# Patient Record
Sex: Female | Born: 1955 | Race: White | Hispanic: No | Smoking: Current every day smoker
Health system: Southern US, Community
[De-identification: ages and names within clinical notes are randomized; demographics above are authoritative.]

## PROBLEM LIST (undated history)

## (undated) DIAGNOSIS — M5136 Other intervertebral disc degeneration, lumbar region: Secondary | ICD-10-CM

## (undated) DIAGNOSIS — I10 Essential (primary) hypertension: Secondary | ICD-10-CM

## (undated) DIAGNOSIS — F329 Major depressive disorder, single episode, unspecified: Secondary | ICD-10-CM

## (undated) DIAGNOSIS — M797 Fibromyalgia: Secondary | ICD-10-CM

## (undated) DIAGNOSIS — M51369 Other intervertebral disc degeneration, lumbar region without mention of lumbar back pain or lower extremity pain: Secondary | ICD-10-CM

## (undated) DIAGNOSIS — K219 Gastro-esophageal reflux disease without esophagitis: Secondary | ICD-10-CM

## (undated) DIAGNOSIS — Z720 Tobacco use: Secondary | ICD-10-CM

## (undated) DIAGNOSIS — R7401 Elevation of levels of liver transaminase levels: Secondary | ICD-10-CM

## (undated) DIAGNOSIS — Z8489 Family history of other specified conditions: Secondary | ICD-10-CM

## (undated) DIAGNOSIS — H35039 Hypertensive retinopathy, unspecified eye: Secondary | ICD-10-CM

## (undated) DIAGNOSIS — M67912 Unspecified disorder of synovium and tendon, left shoulder: Secondary | ICD-10-CM

## (undated) DIAGNOSIS — F32A Depression, unspecified: Secondary | ICD-10-CM

## (undated) DIAGNOSIS — E785 Hyperlipidemia, unspecified: Secondary | ICD-10-CM

## (undated) DIAGNOSIS — N3010 Interstitial cystitis (chronic) without hematuria: Secondary | ICD-10-CM

## (undated) DIAGNOSIS — T8859XA Other complications of anesthesia, initial encounter: Secondary | ICD-10-CM

## (undated) DIAGNOSIS — M1990 Unspecified osteoarthritis, unspecified site: Secondary | ICD-10-CM

## (undated) DIAGNOSIS — R740 Nonspecific elevation of levels of transaminase and lactic acid dehydrogenase [LDH]: Secondary | ICD-10-CM

## (undated) DIAGNOSIS — E119 Type 2 diabetes mellitus without complications: Secondary | ICD-10-CM

## (undated) DIAGNOSIS — F419 Anxiety disorder, unspecified: Secondary | ICD-10-CM

## (undated) DIAGNOSIS — J45909 Unspecified asthma, uncomplicated: Secondary | ICD-10-CM

## (undated) DIAGNOSIS — M5126 Other intervertebral disc displacement, lumbar region: Secondary | ICD-10-CM

## (undated) DIAGNOSIS — H409 Unspecified glaucoma: Secondary | ICD-10-CM

## (undated) HISTORY — PX: BREAST CYST EXCISION: SHX579

## (undated) HISTORY — PX: OTHER SURGICAL HISTORY: SHX169

## (undated) HISTORY — PX: TUBAL LIGATION: SHX77

## (undated) HISTORY — DX: Unspecified glaucoma: H40.9

## (undated) HISTORY — PX: CYSTO: SHX6284

## (undated) HISTORY — PX: CATARACT EXTRACTION: SUR2

## (undated) HISTORY — PX: ABDOMINAL HYSTERECTOMY: SHX81

## (undated) HISTORY — PX: ULNAR NERVE REPAIR: SHX2594

## (undated) HISTORY — PX: CYSTOSCOPY: SUR368

## (undated) HISTORY — PX: EYE SURGERY: SHX253

## (undated) HISTORY — PX: BLEPHAROPLASTY: SUR158

## (undated) HISTORY — PX: BREAST BIOPSY: SHX20

## (undated) HISTORY — DX: Hypertensive retinopathy, unspecified eye: H35.039

---

## 2003-07-14 ENCOUNTER — Ambulatory Visit (HOSPITAL_COMMUNITY): Admission: RE | Admit: 2003-07-14 | Discharge: 2003-07-14 | Payer: Self-pay | Admitting: Urology

## 2003-07-14 ENCOUNTER — Ambulatory Visit (HOSPITAL_BASED_OUTPATIENT_CLINIC_OR_DEPARTMENT_OTHER): Admission: RE | Admit: 2003-07-14 | Discharge: 2003-07-14 | Payer: Self-pay | Admitting: Urology

## 2003-07-14 ENCOUNTER — Encounter (INDEPENDENT_AMBULATORY_CARE_PROVIDER_SITE_OTHER): Payer: Self-pay

## 2004-07-05 ENCOUNTER — Ambulatory Visit (HOSPITAL_COMMUNITY): Admission: RE | Admit: 2004-07-05 | Discharge: 2004-07-05 | Payer: Self-pay | Admitting: Surgery

## 2004-07-05 ENCOUNTER — Ambulatory Visit (HOSPITAL_BASED_OUTPATIENT_CLINIC_OR_DEPARTMENT_OTHER): Admission: RE | Admit: 2004-07-05 | Discharge: 2004-07-05 | Payer: Self-pay | Admitting: Surgery

## 2005-04-18 ENCOUNTER — Ambulatory Visit (HOSPITAL_COMMUNITY): Admission: RE | Admit: 2005-04-18 | Discharge: 2005-04-18 | Payer: Self-pay | Admitting: Urology

## 2005-04-18 ENCOUNTER — Ambulatory Visit (HOSPITAL_BASED_OUTPATIENT_CLINIC_OR_DEPARTMENT_OTHER): Admission: RE | Admit: 2005-04-18 | Discharge: 2005-04-18 | Payer: Self-pay | Admitting: Urology

## 2007-05-31 ENCOUNTER — Ambulatory Visit (HOSPITAL_BASED_OUTPATIENT_CLINIC_OR_DEPARTMENT_OTHER): Admission: RE | Admit: 2007-05-31 | Discharge: 2007-05-31 | Payer: Self-pay | Admitting: Urology

## 2007-09-05 HISTORY — PX: JOINT REPLACEMENT: SHX530

## 2008-08-10 ENCOUNTER — Inpatient Hospital Stay (HOSPITAL_COMMUNITY): Admission: RE | Admit: 2008-08-10 | Discharge: 2008-08-13 | Payer: Self-pay | Admitting: Orthopedic Surgery

## 2011-01-17 NOTE — Op Note (Signed)
NAMEBAYLE, CALVO               ACCOUNT NO.:  [id number]   MEDICAL RECORD NO.:  [id number]          PATIENT TYPE:  INP   LOCATION:  5006                         FACILITY:  [facility]   PHYSICIAN:  Robert A. Thurston Hole, M.D. DATE OF BIRTH:  [birth year]   DATE OF PROCEDURE:  DATE OF DISCHARGE:                               OPERATIVE REPORT   PREOPERATIVE DIAGNOSES:  1. Left knee degenerative joint disease.  2. 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   POSTOPERATIVE DIAGNOSES:  1. Left knee degenerative joint disease.  2. 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   PROCEDURE:  1. Left knee examination under anesthesia followed by total knee      replacement using DePuy cemented total knee system with #2 cemented      femur, #2 cemented tibia with 12.5-mm polyethylene RP tibial      spacer, and 32-mm polyethylene cemented patella.  2. Left thumb carpometacarpal joint, cortisone injection.   SURGEON:  Elana Alm. Thurston Hole, MD   ASSISTANT:  Julien Girt, PA   ANESTHESIA:  General.   OPERATIVE TIME:  1 hour and 30 minutes.   COMPLICATIONS:  None.   DESCRIPTION OF PROCEDURE:  Ms. Rister was brought to the operating room  on August 10, 2008, after a femoral nerve block was placed in holding  area by Anesthesia.  She was placed on operating table in supine  position.  After being placed under general anesthesia, she had a Foley  catheter placed under sterile conditions.  She received Ancef 2 grams IV  preoperatively for prophylaxis.  Her left knee was examined under  anesthesia.  The range of motion from -5 to 125 degrees, mild varus  deformity, knee stable, ligamentous exam with normal patellar tracking.  Her left leg was prepped using sterile DuraPrep and draped using sterile  technique.  Leg was exsanguinated and a thigh tourniquet elevated 365-  mm.  Initially through a 15 cm longitudinal incision based over the  patella, an initial exposure  was made.  The underlying subcutaneous  tissues were incised along with skin incision.  A median arthrotomy was  performed revealing excessive amount of normal-appearing joint fluid.  The articular surfaces were inspected.  She had grade 4 changes  medially, grade 3 and 4 changes laterally, and grade 3 and 4 changes in  the patellofemoral joint.  Osteophytes removed from the femoral condyles  and tibial plateau.  The medial and lateral meniscal remnants were  removed as well as the anterior cruciate ligament.  An intramedullary  drill was then drilled up the femoral canal for placement of distal  femoral cutting jig, which was placed in an appropriate amount of  rotation and a distal 11 mm cut was made.  The distal femur was incised.  The #2 was found to be the appropriate size.  The #2 cutting jig was  placed in the appropriate amount of external rotation and then these  cuts were made.  The proximal tibia was then exposed.  The tibial spines  were removed with an oscillating saw.  An intramedullary drill was  drilled down  the tibial canal for placement of proximal tibial cutting  jig, which was placed in the appropriate amount of rotation and a  proximal 6-mm cut was made based off the medial or lower side.  Spacer  blocks were then placed in flexion and extension.  A 12.5 mm blocks gave  excellent balancing, excellent stability and excellent correction of her  flexion and varus deformities.  At this point, the proximal tibial cut  surface was exposed and the #2 tibial base plate trial was placed and  this was found to be in excellent fit and the keel cut was made.  The  PCL box cutter was then placed on the distal femur and these cuts were  made.  At this point, a #2 femoral trial was placed with the #2 tibial  base plate trial and a 12.5-mm polyethylene RP tibial spacer, and the  knee was reduced, taken through a range of motion from 0 to 125 degrees  with excellent stability and  excellent correction of her flexion and  varus deformities, and normal patellar tracking.  At this point, 38-mm  resurfacing cut was made for a 32-mm patella.  The 3 locking holes  placed for a 32-mm patella.  The patella trial was placed and again  patellofemoral tracking was evaluated and found to be normal.  At this  point, it was felt that all the trial components were of excellent size,  fit, and stability.  They were then removed.  The knee was then jet  lavage irrigated with 3 liters of saline.  The proximal tibia was then  exposed and the #2 tibial base plate with cement backing was hammered  into position with an excellent fit with excess cement being removed  from around the edges.  The #2 femoral component with cement backing was  hammered into position and also with an excellent fit with excess cement  being removed from around the edges.  The 12.5-mm polyethylene RP tibial  spacer was placed on the tibial base plate, the knee reduced, taken  through a range of motion from 0 to 125 degrees with excellent stability  and excellent correction of her flexion and varus deformities.  The 32-  mm polyethylene cement backed patella was then placed in its position  and held there with a clamp.  After this was done, again patellofemoral  tracking was evaluated and found to be normal.  At this point, it was  felt that all the components were of excellent size, fit, and stability.  The knee was further irrigated with saline and then the tourniquet was  released.  Hemostasis obtained with cautery.  The arthrotomy was then  closed with #1 Ethibond suture over two medium Hemovac drains.  Subcutaneous tissue were closed with 0 and 2-0 Vicryl.  Subcuticular  layer closed with 4-0 Monocryl.  Sterile dressings and a long-leg splint  applied.  At this point, the left thumb CMC joint was injected with 40  mg of Depo-Medrol and 1 mL of 0.25% Marcaine under sterile condition and  she tolerated this  well.  At this point, the patient was then awakened,  extubated, and taken to recovery room in stable condition.  Needle and  sponge counts were correct x2 at the end the case.  Neurovascular status  normal as well postoperatively.      Robert A. Thurston Hole, M.D.  Electronically Signed     RAW/MEDQ  D:  08/10/2008  T:  08/11/2008  Job:  [id number]

## 2011-01-17 NOTE — Op Note (Signed)
NAMEJORDEN, Terri Daniel               ACCOUNT NO.:  [id number]   MEDICAL RECORD NO.:  [id number]          PATIENT TYPE:  AMB   LOCATION:  [facility]                         FACILITY:  [facility]   PHYSICIAN:  Jamison Neighbor, M.D.  DATE OF BIRTH:  [birth year]   DATE OF PROCEDURE:  05/31/2007  DATE OF DISCHARGE:                               OPERATIVE REPORT   PREOPERATIVE DIAGNOSIS:  Interstitial cystitis.   POSTOPERATIVE DIAGNOSIS:  Interstitial cystitis.   PROCEDURES:  1. Cystoscopy.  2. Urethral calibration.  3. Hydrodistention of the bladder.  4. Marcaine and Pyridium instillation.  5. Marcaine and Kenalog injection.  6. Botox injection (3 ampules).   SURGEON:  Jamison Neighbor, M.D.   ANESTHESIA:  General.   COMPLICATIONS:  None.   DRAINS:  None.   BRIEF HISTORY:  This 55 year old female is known to have severe urgency  and frequency associated with interstitial cystitis.  She also has  episodes of urgency incontinence.  The patient has been on extensive  medications and did well until recently, when she began to having  worsening problems with her situation.  We have talked to her about  InterStim as a form of neuromodulation.  We have talked to her about  additional instillation therapy, but she has responded beautifully in  the past to hydrodistention with Botox and she has requested that be  repeated.  She understands the risks and benefits of the procedure and  gave full informed consent.   PROCEDURE:  After successful induction of general anesthesia, the  patient was placed in the dorsal position, prepped with Betadine and  draped in the usual sterile fashion.  Careful bimanual examination  revealed no significant cystocele, rectocele or enterocele.  The patient  has recently had some hemorrhoid problems but there was no active  bleeding identified at this time.  The urethra was calibrated to a 79-  [place] with female urethral sounds with no sign of stenosis or  stricture.   The cystoscope was inserted.  The bladder was carefully  inspected.  No tumors or stones could be seen.  Both ureteral orifices  were normal in configuration and location.  Hydrodistention of the  bladder was performed.  The bladder was distended to a pressure of 100  cmH2O for 5 minutes.  When the bladder was drained the patient had no  real glomerulations, indicating a nice response to her therapy.  Her  bladder capacity is 750 mL, which while slightly diminished is much  better than the average IC capacity of 575.  The patient underwent Botox  injection with 3 units delivered throughout the bladder.  The patient  tolerated this without difficulty.  She was given  a Marcaine and Pyridium instillation, Marcaine and Kenalog were injected  periurethrally.  The patient tolerated the procedure well and was taken  to the recovery room in good condition.  She already has pain medication  as well as urinary analgesics, so she will simply be given doxycycline.  She will also be given Anusol-HC for her hemorrhoids.      Jamison Neighbor, M.D.  Electronically Signed  RJE/MEDQ  D:  05/31/2007  T:  06/01/2007  Job:  [id number]

## 2011-01-17 NOTE — Op Note (Signed)
[name], PURPURA               ACCOUNT NO.:  [id number]   MEDICAL RECORD NO.:  [id number]          PATIENT TYPE:  INP   LOCATION:  5006                         FACILITY:  [facility]   PHYSICIAN:  Robert A. Thurston Hole, M.D. DATE OF BIRTH:  1956-03-19   DATE OF PROCEDURE:  08/10/2008  DATE OF DISCHARGE:                               OPERATIVE REPORT   PREOPERATIVE DIAGNOSES:  1. Left knee degenerative joint disease.  2. 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   POSTOPERATIVE DIAGNOSES:  1. Left knee degenerative joint disease.  2. 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   PROCEDURES:  1. Left total knee replacement using DePuy cemented total knee system      with #2 cemented femur.  2. Cemented tibia with 12.5 mm polyethylene RP tibial spacer and 32 mm      polyethylene cemented patella.  3. Left thumb CMC joint cortisone injection.   SURGEON:  Nilda Simmer, MD.   ASSISTANT:  Ileene Hutchinson, [place].   OPERATIVE TIME:  1 hour and 30 minutes.   COMPLICATIONS:  None.   DICTATION ENDED AT THIS POINT      Robert A. Thurston Hole, M.D.     RAW/MEDQ  D:  08/10/2008  T:  08/11/2008  Job:  [id number]

## 2011-01-17 NOTE — Discharge Summary (Signed)
NAMESHERIL, HAMMOND               ACCOUNT NO.:  [id number]   MEDICAL RECORD NO.:  [id number]          Terri Daniel TYPE:  INP   LOCATION:  5006                         FACILITY:  [facility]   PHYSICIAN:  Robert A. Thurston Hole, M.D. DATE OF BIRTH:  Jan 21, 1956   DATE OF ADMISSION:  08/10/2008  DATE OF DISCHARGE:  08/13/2008                               DISCHARGE SUMMARY   ADMITTING DIAGNOSES:  1. Left knee end-stage degenerative joint disease.  2. Interstitial cystitis.  3. Irritable bowel syndrome.  4. Anxiety.  5. Depression.  6. Gastroesophageal reflux disease.  7. Glaucoma.  8. Hypertension.  9. History of a fever of unknown origin intermittently since 2006.  10.Asthma.  11.Fibromyalgia.   DISCHARGE DIAGNOSES:  1. Status post left total knee replacement.  2. Interstitial cystitis.  3. Hypertension.  4. Fibromyalgia.  5. Anxiety.  6. Depression.  7. Glaucoma.  8. History of fever of unknown origin intermittently since 2006,      currently afebrile.  9. Gastroesophageal reflux disease.  10.Irritable bowel syndrome.   HISTORY OF PRESENT ILLNESS:  The Terri Daniel is a 55 year old white female  with a history of end-stage DJD of her left knee.  Terri Daniel has failed  conservative care including interarticular cortisone injections and anti-  inflammatories.  Terri Daniel understands the risks, benefits, and possible  complications of a left total knee replacement and is without question.   Procedures in-house on August 10, 2008, the Terri Daniel underwent a left  total knee replacement by Dr. Thurston Hole, a left femoral nerve block by  anesthesia, and the Terri Daniel's Autovac only had 200 mL in it  postoperatively, so Terri Daniel did not receive an Autovac transfusion.  Postop  day 0, the Terri Daniel had episodes of apnea while on her PCA.  Terri Daniel was  given Narcan at 6 p.m. and by 6:30 was in excruciating pain.  Her mother  stayed through the night that night to watch her apnea and monitored her  PCA dosage.  Postop day 1, the  Terri Daniel was very sedated.  Terri Daniel was on  Dilaudid PCA on exam.  I discontinued her PCA due to her sedation.  Her  Foley was removed.  At the time of Foley removal, the Terri Daniel self-  administered a 10 mg Valium suppository.  The Terri Daniel has interstitial  cystitis and whenever catheters are inserted and/or removed, Terri Daniel needs a  Valium suppository to prevent bladder spasms.  Her interstitial cystitis  is monitored by Dr. Logan Bores the urologist.  Because of her sedation from  pain medicine Terri Daniel was placed on Tylenol 325 mg 2 tablets every 4 hours  scheduled.  OxyIR 5 mg 1-2 q.4 h. was ordered p.r.n. for pain control,  but with instructions not to give if Terri Daniel is too sedated.  Terri Daniel was given  milk of magnesia to facilitate her bowels.  Postop day 2, the Terri Daniel  between postop day 1 and postop day 2, the Terri Daniel fell getting up from  the toilet.  Terri Daniel did not call for help.  Terri Daniel attempted to do this on her  own and her left knee buckled and Terri Daniel fell.  X-rays were taken.  No  abnormalities were seen.  There was no damage to her wound.  On  examination in the morning of postop day 2, Terri Daniel was doing very well.  Her hemoglobin was stable at 9.9, T-max was 99.5.  Terri Daniel was metabolically  stable.  An INR was therapeutic at 2.3.  Her Lovenox was discontinued.  Terri Daniel ambulated 100 feet minimal guard assist.  Terri Daniel was given a Dulcolax  suppository.  Postop day 3, the Terri Daniel is alert, oriented, and in no  acute distress.  Terri Daniel is dressed.  Her surgical wound is well  approximated.  Mepitel was intact.  Terri Daniel is neurovascularly intact.  Unable to do a straight leg raise, but does have some early quad  control.  Her hemoglobin is 11.5.  Terri Daniel is metabolically stable with a  normal basic metabolic panel.  Blood pressure stable at 129/85.  Terri Daniel has  been afebrile for greater than 24 hours.  Terri Daniel is having no difficulty  with her bladder at this time.  Terri Daniel is being discharged to a skilled  nursing facility today.  Her plans are  to remain in skilled nursing  facility until her daughter gets home from college on December 23.  The  Terri Daniel is single with a significant other.  Terri Daniel is concerned that her  significant other cannot care for by himself and would like to await to  stay at a skilled nursing facility until her daughter gets home from  college.  Terri Daniel is being discharged to a skilled nursing facility in  stable condition.  Terri Daniel is weightbearing as tolerated.  Terri Daniel is on a  regular diet.   DISCHARGE MEDICATIONS:  1. Tylenol 325 mg 2 tablets q.4 h. scheduled.  2. OxyIR 5 mg 1-2 tablets q.4 h. p.r.n. pain.  3. Coumadin per pharmacy protocol.  Her Coumadin doses while Terri Daniel has      been in the hospital are, on August 10, 2008, Terri Daniel was given 7.5 mg      of Coumadin, which raised her INR to 1.  On August 11, 2008, Terri Daniel      was given 7.5 mg again, and on August 12, 2008, her INR was 2.3.      On August 12, 2008, her Coumadin was held, and on August 13, 2008, her INR was still therapeutic at 2.3.  The rest of her      discharge medications are as follows.  4. Lisinopril.  5. Hydrochlorothiazide 20/12.5 one tablet a day, hold until blood      pressure is greater than 120/80.  6. Celebrex 200 mg 1 tablet a day.  7. Crestor 10 mg 1 tablet a day.  8. Clonidine 0.1 mg 1 tablet every evening.  9. Hydroxyzine 25 mg 1 tablet every evening.  10.Savella 50 mg 1 tablet twice a day.  The Terri Daniel brought own supply      to the hospital.  11.Elavil 25 mg 1 tablet every p.m.  12.Potassium chloride 10 mEq 1 tablet every morning.  This is      currently on hold.  13.Ambien 10 mg p.o. at bedtime p.r.n. sleep.  14.Prilosec 20 mg 1 tablet q.a.m.  15.Fluticasone as needed.  16.Valium suppositories 10 mg 1 PR for self-catheterization as needed      for interstitial cystitis no more often than q.8 h. currently on      hold.  17.Valium 5 mg 1 tablet p.o. q.12 h. p.r.n. anxiety.  18.Colace  100 mg 1 tablet twice a day.   19.Senokot-S 2 tablets with dinner if no BM in 2 days.  20.Dulcolax suppository 1 PR q.4 h. p.r.n. if no BM in 3 days.   DISCHARGE INSTRUCTIONS:  Terri Daniel will need to follow up in Dr. Sherene Sires  office on August 24, 2008.  Please call [phone] to make that  appointment.  Please call our office [phone] with increased pain,  increased swelling, increased redness, increased drainage, or temp  greater than 101.5.      Kirstin Shepperson, P.A.      Robert A. Thurston Hole, M.D.  Electronically Signed    KS/MEDQ  D:  08/13/2008  T:  08/13/2008  Job:  [id number]

## 2011-01-20 NOTE — Op Note (Signed)
[name], Terri Daniel               ACCOUNT NO.:  [id number]   MEDICAL RECORD NO.:  [id number]          PATIENT TYPE:  AMB   LOCATION:  [facility]                         FACILITY:  [facility]   PHYSICIAN:  Jamison Neighbor, M.D.  DATE OF BIRTH:  [birth year]   DATE OF PROCEDURE:  07/05/2004  DATE OF DISCHARGE:                                 OPERATIVE REPORT   PREOPERATIVE DIAGNOSIS:  Interstitial cystitis.   POSTOPERATIVE DIAGNOSIS:  Interstitial cystitis.   PROCEDURE:  Cystoscopy, urethral calibration, hydrodistention of the  bladder, Marcaine and Pyridium installation, Marcaine and __________  injection.   SURGEON:  Jamison Neighbor, M.D.   ANESTHESIA:  General.   COMPLICATIONS:  None.   DRAINS:  None.   INDICATIONS FOR PROCEDURE:  This 55 year old female is known to have  interstitial cystitis.  The patient has had a cystoscopy and  hydrodistention, which has confirmed this diagnosis.  The patient has had a  significant flare of her symptoms with very poor control of her bladder  pain.  She has also had ongoing issues with pelvic floor dysfunction and  fibromyalgia.  The patient has not done well with installation therapy or  oral therapy or physical therapy.  It is requested that she had a repeat  hydrodistention.  She understands the risks and benefits of the procedure,  and gave a full informed consent.   DESCRIPTION OF PROCEDURE:  After a successful induction of general  anesthesia, the patient was placed in the dorsal lithotomy position and  prepped with Betadine and draped in the usual sterile fashion.  The careful  bimanual examination revealed no cystocele, rectocele, or enterocele.  There  are no masses on bimanual examination.  The urethra was free of any masses  visibly, specifically no evidence of a diverticulum.  The urethra was  calibrated to 32-French with __________  urethral sounds, with no evidence  of stenosis or stricture.  The cystoscope was inserted.  The  bladder was  carefully inspected.  It was free of any tumors or stones.  Both ureteral  orifices were of normal configuration and location.  The patient had no  evidence of tumor or other abnormalities in the bladder mucosa.  The bladder  was distended at a pressure of 100 cm of water for five minutes.  When the  bladder was drained, glomerulations could be seen throughout the bladder.  The bladder capacity was 800 mL, which is better than the average  interstitial cystitis volume of 575, but somewhat worse than average volume  of 1150 mL.  There was nothing that required a biopsy at this time.  The  bladder was drained and a mixture of Marcaine and Pyridium was left in the  bladder.  Marcaine and __________  were injected for a pudendal block.  The  patient received intraoperative Toradol, Zofran and B&O suppository.   DISPOSITION:  She will be sent home with Pyridium Plus and doxycycline.  The  patient already has OxyContin for long-term pain management, so will not  require additional pain medications.  The patient will return to the office  in followup,  at which time we will discuss other options for therapy.      RJE/MEDQ  D:  07/05/2004  T:  07/05/2004  Job:  [id number]   cc:   Dewain Penning, M.D.  [place], [place]

## 2011-01-20 NOTE — Op Note (Signed)
Terri Daniel, CUFF                           ACCOUNT NO.:  [id number]   MEDICAL RECORD NO.:  [id number]                   PATIENT TYPE:  AMB   LOCATION:  [facility]                                 FACILITY:  [facility]   PHYSICIAN:  Jamison Neighbor, M.D.               DATE OF BIRTH:  10-13-1955   DATE OF PROCEDURE:  07/14/2003  DATE OF DISCHARGE:                                 OPERATIVE REPORT   PREOPERATIVE DIAGNOSES:  1. Chronic pelvic pain.  2. Probable interstitial cystitis.   POSTOPERATIVE DIAGNOSIS:  Interstitial cystitis.   PROCEDURES:  1. Cystoscopy.  2. Urethral calibration.  3. Hydrodistention of bladder.  4. Bladder biopsy.  5. Marcaine and Pyridium instillation.  6. Marcaine and Kenalog injection.   SURGEON:  Jamison Neighbor, M.D.   ANESTHESIA:  General.   COMPLICATIONS:  None.   DRAINS:  None.   BRIEF HISTORY:  This patient has had problems with chronic lower urinary  tract urgency, frequency, and pain.  She has been treated at [facility] by Dr. Lorin Picket McDiarmid for dysfunctional voiding and has been on  a combination of Flomax and Elavil.  The patient is felt to have  interstitial cystitis and needs to undergo diagnostic evaluation.  She was  given the opportunity to consider potassium provocation testing but  requested a hydrodistention be done as she is very fearful of the  possibility that she might have a bladder cancer.  She was reassured that  bladder cancer was a very low risk for her but the patient insists that  cystoscopic examination and biopsy be performed.  She understands the risks  and benefits of the procedure and gave full and informed consent.   DESCRIPTION OF PROCEDURE:  After the successful induction of general  anesthesia, the patient was placed in the dorsal lithotomy position, prepped  with Betadine, and draped in the usual sterile fashion.  Careful bimanual  examination revealed no cystocele, rectocele, or enterocele.  The  uterus was  palpably normal and not particularly enlarged.  The urethra was calibrated  at 73 [place] with female urethral sounds without evidence of stenosis or  stricture.  The cystoscope was inserted, the bladder was carefully  inspected.  It was free of any tumor or stones.  Both ureteral orifices were  normal in configuration and location.  Clear urine was seen to efflux from  each.  The bladder was distended at a pressure of 100 cmH2O for five  minutes.  When the bladder was drained the patient was found to have a  terminal blood tinge.  Glomerulations could be seen throughout the bladder.  Bladder capacity was diminished at 700 mL.  This compares to a normal  bladder capacity of 1150 under anesthesia and an average for interstitial  cystitis in one series of 575.  A biopsy was taken and was sent for mast  cell analysis as well  as to rule out carcinoma in situ.  The biopsy site was  cauterized.  A mixture of Marcaine and Pyridium was left within the bladder.  Marcaine and Kenalog were injected periurethrally.  The patient received  intraoperative B&O suppository as well as intraoperative Zofran and Toradol.  She will be sent home on Lorcet Plus, Pyridium Plus, and antibiotic  coverage, to return to the office in two to three weeks' time.  Will  continue the patient on her combination medications, including Elavil,  Elmiron, Atarax, Flomax, as well as other supportive therapy as needed.  The  patient will also be given instructions on instillation therapy and will be  considered for Interstim testing as well.                                               Jamison Neighbor, M.D.    RJE/MEDQ  D:  07/14/2003  T:  07/14/2003  Job:  [id number]   cc:   Lorin Picket McDiarmid, MD., [facility]

## 2011-01-20 NOTE — Op Note (Signed)
Terri Daniel, Terri Daniel               ACCOUNT NO.:  [id number]   MEDICAL RECORD NO.:  [id number]          PATIENT TYPE:  AMB   LOCATION:  [facility]                         FACILITY:  [facility]   PHYSICIAN:  Jamison Neighbor, M.D.  DATE OF BIRTH:  [birth year]   DATE OF PROCEDURE:  04/18/2005  DATE OF DISCHARGE:                                 OPERATIVE REPORT   SERVICE:  Urology.   PREOPERATIVE DIAGNOSES:  Interstitial cystitis.   POSTOPERATIVE DIAGNOSES:  Interstitial cystitis.   PROCEDURE:  Cystoscopy, urethral calibration, hydrodistention of the  bladder, Marcaine and Pyridium installation, Marcaine and Kenalog injection.   SURGEON:  Jamison Neighbor, M.D.   ANESTHESIA:  General.   COMPLICATIONS:  None.   DRAINS:  None.   BRIEF HISTORY:  This 55 year old female was known to have interstitial  cystitis and has undergone previous diagnostic cystoscopy and  hydrodistention. The patient is on very aggressive management with multiple  medications including Elmiron, Elavil, Atarax, Flexeril and OxyContin along  with valium for her pelvic floor dysfunction. She has not been able to  control her symptoms and has requested a repeat hydrodistention. The  patient's last hydrodistention was in November of last year with good  results. This seemed to hold her for many months. She understands that there  is no guarantee that she will have the same response that she has had in the  past. She gave full and informed consent. The fact that the patient is still  smoking is one reason it is thought that a repeat cystoscopy should be  performed in order ensure that her symptoms are due to IC and not to  carcinoma in situ or transitional cell carcinoma.   DESCRIPTION OF PROCEDURE:  After successful induction of general anesthesia,  the patient was placed in the dorsal lithotomy position, prepped with  Betadine and draped in the usual sterile fashion. The patient has had a  fairly recent abdominal  hysterectomy. Careful manual inspection and visual  inspection showed that the cuff was well supported with no signs of any  drainage or infection. The patient has noted that she has been having  problems with what she describes as febrile events but no abnormalities  related to the hysterectomy could be identified on bimanual exam. There was  no significant cystocele, rectocele or enterocele. The urethra was  calibrated at 80 [place] with female urethral sounds with no evidence of  stenosis or stricture. The cystoscope was inserted, the bladder was  carefully inspected and was free of any tumor or stones. There was at best  minimal squamous metaplasia with no bleeding of any kind. The ureteral  orifices were normal, the urine coming out was clear. Careful inspection  with 12 and 70 degree lenses showed no evidence whatsoever for carcinoma in  situ or TCC.  The bladder was distended at a pressure of 100 cm of water for  five minutes. When the bladder was drained, the bladder capacity was found  to be just over 700 mL. There was very minimal bleeding and much more modest  glomerulations than had been seen in  the past. We would assume that this is  due to the improvement she has had with her Elmiron. The bladder was  drained and a mixture of Marcaine and Pyridium was left within the bladder,  Marcaine and Kenalog mixture was injected into the periurethral space for a  pudendal nerve block. The patient tolerated the procedure well and was taken  to the recovery room in good condition.           ______________________________  Jamison Neighbor, M.D.  Electronically Signed     RJE/MEDQ  D:  04/18/2005  T:  04/18/2005  Job:  [id number]

## 2011-06-09 LAB — CBC
HCT: 29.3 % — ABNORMAL LOW (ref 36.0–46.0)
Hemoglobin: 11.5 g/dL — ABNORMAL LOW (ref 12.0–15.0)
Hemoglobin: 9.9 g/dL — ABNORMAL LOW (ref 12.0–15.0)
MCV: 87.4 fL (ref 78.0–100.0)
Platelets: 318 10*3/uL (ref 150–400)
Platelets: 348 10*3/uL (ref 150–400)
RBC: 3.29 MIL/uL — ABNORMAL LOW (ref 3.87–5.11)
RBC: 3.62 MIL/uL — ABNORMAL LOW (ref 3.87–5.11)
RBC: 3.81 MIL/uL — ABNORMAL LOW (ref 3.87–5.11)
RBC: 4.59 MIL/uL (ref 3.87–5.11)
RDW: 13.6 % (ref 11.5–15.5)
WBC: 10.1 10*3/uL (ref 4.0–10.5)
WBC: 10.5 10*3/uL (ref 4.0–10.5)
WBC: 15.7 10*3/uL — ABNORMAL HIGH (ref 4.0–10.5)
WBC: 9.9 10*3/uL (ref 4.0–10.5)

## 2011-06-09 LAB — PROTIME-INR
INR: 1 (ref 0.00–1.49)
INR: 2.3 — ABNORMAL HIGH (ref 0.00–1.49)
INR: 2.3 — ABNORMAL HIGH (ref 0.00–1.49)
Prothrombin Time: 13.9 seconds (ref 11.6–15.2)
Prothrombin Time: 26.9 seconds — ABNORMAL HIGH (ref 11.6–15.2)

## 2011-06-09 LAB — URINALYSIS, ROUTINE W REFLEX MICROSCOPIC
Glucose, UA: NEGATIVE mg/dL
Ketones, ur: NEGATIVE mg/dL
pH: 6 (ref 5.0–8.0)

## 2011-06-09 LAB — BASIC METABOLIC PANEL
BUN: 11 mg/dL (ref 6–23)
Calcium: 8.2 mg/dL — ABNORMAL LOW (ref 8.4–10.5)
Chloride: 109 mEq/L (ref 96–112)
Creatinine, Ser: 0.71 mg/dL (ref 0.4–1.2)
Creatinine, Ser: 1.07 mg/dL (ref 0.4–1.2)
GFR calc Af Amer: >60 mL/min (ref >60)
GFR calc Af Amer: >60 mL/min (ref >60)
GFR calc non Af Amer: 54 mL/min — ABNORMAL LOW (ref >60)
GFR calc non Af Amer: >60 mL/min (ref >60)
Potassium: 3.9 mEq/L (ref 3.5–5.1)
Potassium: 3.9 mEq/L (ref 3.5–5.1)
Sodium: 136 mEq/L (ref 135–145)
Sodium: 138 mEq/L (ref 135–145)
Sodium: 142 mEq/L (ref 135–145)

## 2011-06-09 LAB — DIFFERENTIAL
Basophils Absolute: 0.1 10*3/uL (ref 0.0–0.1)
Basophils Relative: 1 % (ref 0–1)
Eosinophils Absolute: 0.1 10*3/uL (ref 0.0–0.7)
Eosinophils Relative: 1 % (ref 0–5)
Lymphocytes Relative: 37 % (ref 12–46)
Monocytes Absolute: 0.5 10*3/uL (ref 0.1–1.0)

## 2011-06-09 LAB — URINE CULTURE: Colony Count: 8000

## 2011-06-09 LAB — COMPREHENSIVE METABOLIC PANEL
ALT: 47 U/L — ABNORMAL HIGH (ref 0–35)
AST: 27 U/L (ref 0–37)
Albumin: 3.9 g/dL (ref 3.5–5.2)
Alkaline Phosphatase: 116 U/L (ref 39–117)
Chloride: 104 mEq/L (ref 96–112)
GFR calc Af Amer: >60 mL/min (ref >60)
Potassium: 3.7 mEq/L (ref 3.5–5.1)
Total Bilirubin: 0.4 mg/dL (ref 0.3–1.2)

## 2011-06-09 LAB — TYPE AND SCREEN: Antibody Screen: NEGATIVE

## 2011-06-15 LAB — POCT I-STAT 4, (NA,K, GLUC, HGB,HCT)
Glucose, Bld: 107 — ABNORMAL HIGH
Hemoglobin: 13.3
Potassium: 4
Sodium: 136

## 2011-07-02 DIAGNOSIS — R350 Frequency of micturition: Secondary | ICD-10-CM | POA: Insufficient documentation

## 2011-07-02 DIAGNOSIS — R351 Nocturia: Secondary | ICD-10-CM | POA: Insufficient documentation

## 2011-10-12 DIAGNOSIS — F32A Depression, unspecified: Secondary | ICD-10-CM | POA: Insufficient documentation

## 2011-10-12 DIAGNOSIS — F419 Anxiety disorder, unspecified: Secondary | ICD-10-CM | POA: Insufficient documentation

## 2011-10-12 DIAGNOSIS — K589 Irritable bowel syndrome without diarrhea: Secondary | ICD-10-CM | POA: Insufficient documentation

## 2013-09-09 DIAGNOSIS — R319 Hematuria, unspecified: Secondary | ICD-10-CM | POA: Insufficient documentation

## 2014-07-14 DIAGNOSIS — G588 Other specified mononeuropathies: Secondary | ICD-10-CM | POA: Insufficient documentation

## 2015-10-20 DIAGNOSIS — I10 Essential (primary) hypertension: Secondary | ICD-10-CM | POA: Insufficient documentation

## 2015-10-20 DIAGNOSIS — K219 Gastro-esophageal reflux disease without esophagitis: Secondary | ICD-10-CM | POA: Insufficient documentation

## 2015-11-14 DIAGNOSIS — Z8371 Family history of colonic polyps: Secondary | ICD-10-CM | POA: Insufficient documentation

## 2015-11-15 DIAGNOSIS — R1310 Dysphagia, unspecified: Secondary | ICD-10-CM | POA: Insufficient documentation

## 2015-11-15 DIAGNOSIS — R748 Abnormal levels of other serum enzymes: Secondary | ICD-10-CM | POA: Insufficient documentation

## 2015-12-23 DIAGNOSIS — J4530 Mild persistent asthma, uncomplicated: Secondary | ICD-10-CM | POA: Insufficient documentation

## 2015-12-30 DIAGNOSIS — M25871 Other specified joint disorders, right ankle and foot: Secondary | ICD-10-CM | POA: Insufficient documentation

## 2015-12-30 DIAGNOSIS — M25571 Pain in right ankle and joints of right foot: Secondary | ICD-10-CM | POA: Insufficient documentation

## 2016-08-14 ENCOUNTER — Ambulatory Visit: Payer: Self-pay

## 2017-01-08 ENCOUNTER — Encounter: Payer: Self-pay | Admitting: Endocrinology

## 2017-01-29 DIAGNOSIS — H401111 Primary open-angle glaucoma, right eye, mild stage: Secondary | ICD-10-CM | POA: Insufficient documentation

## 2017-01-29 DIAGNOSIS — H401121 Primary open-angle glaucoma, left eye, mild stage: Secondary | ICD-10-CM | POA: Insufficient documentation

## 2017-02-06 ENCOUNTER — Emergency Department (HOSPITAL_COMMUNITY): Payer: Medicare HMO

## 2017-02-06 ENCOUNTER — Observation Stay (HOSPITAL_COMMUNITY)
Admission: EM | Admit: 2017-02-06 | Discharge: 2017-02-07 | Disposition: A | Discharge status: Home / Self Care | Payer: Medicare HMO | Attending: Internal Medicine | Admitting: Internal Medicine

## 2017-02-06 ENCOUNTER — Observation Stay (HOSPITAL_BASED_OUTPATIENT_CLINIC_OR_DEPARTMENT_OTHER): Payer: Medicare HMO

## 2017-02-06 ENCOUNTER — Encounter (HOSPITAL_COMMUNITY): Payer: Self-pay | Admitting: Nurse Practitioner

## 2017-02-06 DIAGNOSIS — G8929 Other chronic pain: Secondary | ICD-10-CM | POA: Diagnosis not present

## 2017-02-06 DIAGNOSIS — R0789 Other chest pain: Secondary | ICD-10-CM | POA: Diagnosis not present

## 2017-02-06 DIAGNOSIS — M1990 Unspecified osteoarthritis, unspecified site: Secondary | ICD-10-CM | POA: Insufficient documentation

## 2017-02-06 DIAGNOSIS — I259 Chronic ischemic heart disease, unspecified: Secondary | ICD-10-CM

## 2017-02-06 DIAGNOSIS — I200 Unstable angina: Secondary | ICD-10-CM | POA: Diagnosis present

## 2017-02-06 DIAGNOSIS — M67912 Unspecified disorder of synovium and tendon, left shoulder: Secondary | ICD-10-CM | POA: Diagnosis present

## 2017-02-06 DIAGNOSIS — M25812 Other specified joint disorders, left shoulder: Secondary | ICD-10-CM | POA: Insufficient documentation

## 2017-02-06 DIAGNOSIS — R079 Chest pain, unspecified: Secondary | ICD-10-CM | POA: Diagnosis not present

## 2017-02-06 DIAGNOSIS — M797 Fibromyalgia: Secondary | ICD-10-CM | POA: Diagnosis present

## 2017-02-06 DIAGNOSIS — K219 Gastro-esophageal reflux disease without esophagitis: Secondary | ICD-10-CM | POA: Diagnosis not present

## 2017-02-06 DIAGNOSIS — N3010 Interstitial cystitis (chronic) without hematuria: Secondary | ICD-10-CM | POA: Insufficient documentation

## 2017-02-06 DIAGNOSIS — E1165 Type 2 diabetes mellitus with hyperglycemia: Secondary | ICD-10-CM | POA: Diagnosis present

## 2017-02-06 DIAGNOSIS — Z882 Allergy status to sulfonamides status: Secondary | ICD-10-CM | POA: Diagnosis not present

## 2017-02-06 DIAGNOSIS — Z8249 Family history of ischemic heart disease and other diseases of the circulatory system: Secondary | ICD-10-CM | POA: Diagnosis not present

## 2017-02-06 DIAGNOSIS — F17210 Nicotine dependence, cigarettes, uncomplicated: Secondary | ICD-10-CM | POA: Insufficient documentation

## 2017-02-06 DIAGNOSIS — D72829 Elevated white blood cell count, unspecified: Secondary | ICD-10-CM | POA: Insufficient documentation

## 2017-02-06 DIAGNOSIS — Z6831 Body mass index (BMI) 31.0-31.9, adult: Secondary | ICD-10-CM | POA: Diagnosis not present

## 2017-02-06 DIAGNOSIS — F329 Major depressive disorder, single episode, unspecified: Secondary | ICD-10-CM | POA: Diagnosis not present

## 2017-02-06 DIAGNOSIS — I10 Essential (primary) hypertension: Secondary | ICD-10-CM | POA: Insufficient documentation

## 2017-02-06 DIAGNOSIS — R61 Generalized hyperhidrosis: Secondary | ICD-10-CM | POA: Insufficient documentation

## 2017-02-06 DIAGNOSIS — E669 Obesity, unspecified: Secondary | ICD-10-CM | POA: Insufficient documentation

## 2017-02-06 DIAGNOSIS — Z780 Asymptomatic menopausal state: Secondary | ICD-10-CM | POA: Diagnosis not present

## 2017-02-06 DIAGNOSIS — R252 Cramp and spasm: Secondary | ICD-10-CM | POA: Diagnosis not present

## 2017-02-06 DIAGNOSIS — J45909 Unspecified asthma, uncomplicated: Secondary | ICD-10-CM | POA: Diagnosis present

## 2017-02-06 DIAGNOSIS — E7800 Pure hypercholesterolemia, unspecified: Secondary | ICD-10-CM | POA: Diagnosis not present

## 2017-02-06 DIAGNOSIS — M549 Dorsalgia, unspecified: Secondary | ICD-10-CM | POA: Diagnosis not present

## 2017-02-06 DIAGNOSIS — F419 Anxiety disorder, unspecified: Secondary | ICD-10-CM | POA: Insufficient documentation

## 2017-02-06 DIAGNOSIS — E1169 Type 2 diabetes mellitus with other specified complication: Secondary | ICD-10-CM | POA: Diagnosis present

## 2017-02-06 DIAGNOSIS — E785 Hyperlipidemia, unspecified: Secondary | ICD-10-CM | POA: Diagnosis not present

## 2017-02-06 HISTORY — DX: Interstitial cystitis (chronic) without hematuria: N30.10

## 2017-02-06 HISTORY — DX: Other intervertebral disc displacement, lumbar region: M51.26

## 2017-02-06 HISTORY — DX: Other intervertebral disc degeneration, lumbar region without mention of lumbar back pain or lower extremity pain: M51.369

## 2017-02-06 HISTORY — DX: Unspecified disorder of synovium and tendon, left shoulder: M67.912

## 2017-02-06 HISTORY — DX: Other intervertebral disc degeneration, lumbar region: M51.36

## 2017-02-06 HISTORY — DX: Hyperlipidemia, unspecified: E78.5

## 2017-02-06 HISTORY — DX: Elevation of levels of liver transaminase levels: R74.01

## 2017-02-06 HISTORY — DX: Unspecified osteoarthritis, unspecified site: M19.90

## 2017-02-06 HISTORY — DX: Type 2 diabetes mellitus without complications: E11.9

## 2017-02-06 HISTORY — DX: Anxiety disorder, unspecified: F41.9

## 2017-02-06 HISTORY — DX: Major depressive disorder, single episode, unspecified: F32.9

## 2017-02-06 HISTORY — DX: Depression, unspecified: F32.A

## 2017-02-06 HISTORY — DX: Essential (primary) hypertension: I10

## 2017-02-06 HISTORY — DX: Fibromyalgia: M79.7

## 2017-02-06 HISTORY — DX: Nonspecific elevation of levels of transaminase and lactic acid dehydrogenase (ldh): R74.0

## 2017-02-06 HISTORY — DX: Tobacco use: Z72.0

## 2017-02-06 HISTORY — DX: Gastro-esophageal reflux disease without esophagitis: K21.9

## 2017-02-06 HISTORY — DX: Unspecified asthma, uncomplicated: J45.909

## 2017-02-06 LAB — URINALYSIS, ROUTINE W REFLEX MICROSCOPIC
BACTERIA UA: NONE SEEN
Bilirubin Urine: NEGATIVE
Hgb urine dipstick: NEGATIVE
KETONES UR: NEGATIVE mg/dL
Leukocytes, UA: NEGATIVE
Nitrite: NEGATIVE
PH: 5 (ref 5.0–8.0)
Protein, ur: NEGATIVE mg/dL
SPECIFIC GRAVITY, URINE: 1.02 (ref 1.005–1.030)

## 2017-02-06 LAB — MRSA PCR SCREENING: MRSA BY PCR: NEGATIVE

## 2017-02-06 LAB — CBC
HEMATOCRIT: 39.6 % (ref 36.0–46.0)
Hemoglobin: 13 g/dL (ref 12.0–15.0)
MCH: 29.4 pg (ref 26.0–34.0)
MCHC: 32.8 g/dL (ref 30.0–36.0)
MCV: 89.6 fL (ref 78.0–100.0)
PLATELETS: 292 10*3/uL (ref 150–400)
RBC: 4.42 MIL/uL (ref 3.87–5.11)
RDW: 13.7 % (ref 11.5–15.5)
WBC: 13 10*3/uL — ABNORMAL HIGH (ref 4.0–10.5)

## 2017-02-06 LAB — MAGNESIUM: MAGNESIUM: 2.2 mg/dL (ref 1.7–2.4)

## 2017-02-06 LAB — LIPID PANEL
CHOL/HDL RATIO: 5.1 ratio
Cholesterol: 279 mg/dL — ABNORMAL HIGH (ref 0–200)
HDL: 55 mg/dL (ref >40)
LDL CALC: 197 mg/dL — AB (ref 0–99)
Triglycerides: 137 mg/dL (ref <150)
VLDL: 27 mg/dL (ref 0–40)

## 2017-02-06 LAB — BASIC METABOLIC PANEL
Anion gap: 11 (ref 5–15)
BUN: 15 mg/dL (ref 6–20)
CO2: 20 mmol/L — AB (ref 22–32)
CREATININE: 0.96 mg/dL (ref 0.44–1.00)
Calcium: 9.2 mg/dL (ref 8.9–10.3)
Chloride: 105 mmol/L (ref 101–111)
GFR calc Af Amer: >60 mL/min (ref >60)
GFR calc non Af Amer: >60 mL/min (ref >60)
GLUCOSE: 167 mg/dL — AB (ref 65–99)
POTASSIUM: 3.8 mmol/L (ref 3.5–5.1)
Sodium: 136 mmol/L (ref 135–145)

## 2017-02-06 LAB — I-STAT TROPONIN, ED: Troponin i, poc: 0 ng/mL (ref 0.00–0.08)

## 2017-02-06 LAB — HEPATIC FUNCTION PANEL
ALBUMIN: 3.8 g/dL (ref 3.5–5.0)
ALT: 46 U/L (ref 14–54)
AST: 24 U/L (ref 15–41)
Alkaline Phosphatase: 89 U/L (ref 38–126)
Bilirubin, Direct: <0.1 mg/dL — ABNORMAL LOW (ref 0.1–0.5)
Total Bilirubin: 0.7 mg/dL (ref 0.3–1.2)
Total Protein: 6.5 g/dL (ref 6.5–8.1)

## 2017-02-06 LAB — GLUCOSE, CAPILLARY
GLUCOSE-CAPILLARY: 134 mg/dL — AB (ref 65–99)
Glucose-Capillary: 135 mg/dL — ABNORMAL HIGH (ref 65–99)

## 2017-02-06 LAB — HEPARIN LEVEL (UNFRACTIONATED): Heparin Unfractionated: 0.38 IU/mL (ref 0.30–0.70)

## 2017-02-06 LAB — TROPONIN I
Troponin I: <0.03 ng/mL (ref <0.03)
Troponin I: <0.03 ng/mL (ref <0.03)

## 2017-02-06 MED ORDER — ASPIRIN 81 MG PO CHEW: 81.0000 mg | CHEWABLE_TABLET | Freq: Every day | ORAL | Status: DC

## 2017-02-06 MED ORDER — ACETAMINOPHEN 325 MG PO TABS: 650.0000 mg | ORAL_TABLET | ORAL | Status: DC | PRN

## 2017-02-06 MED ORDER — ONDANSETRON HCL 4 MG/2ML IJ SOLN: 4.0000 mg | Freq: Four times a day (QID) | INTRAMUSCULAR | Status: DC | PRN

## 2017-02-06 MED ORDER — INSULIN ASPART 100 UNIT/ML ~~LOC~~ SOLN
0.0000 [IU] | Freq: Three times a day (TID) | SUBCUTANEOUS | Status: DC
  Administered 2017-02-06: 1 [IU] via SUBCUTANEOUS
  Administered 2017-02-07: 2 [IU] via SUBCUTANEOUS

## 2017-02-06 MED ORDER — ASPIRIN EC 325 MG PO TBEC: 325.0000 mg | DELAYED_RELEASE_TABLET | Freq: Every day | ORAL | Status: DC

## 2017-02-06 MED ORDER — HEPARIN (PORCINE) IN NACL 100-0.45 UNIT/ML-% IJ SOLN
950.0000 [IU]/h | INTRAMUSCULAR | Status: DC
  Administered 2017-02-06: 900 [IU]/h via INTRAVENOUS
  Filled 2017-02-06: qty 250

## 2017-02-06 MED ORDER — INSULIN ASPART 100 UNIT/ML ~~LOC~~ SOLN
0.0000 [IU] | Freq: Every day | SUBCUTANEOUS | Status: DC
Start: 2017-02-06 — End: 2017-02-07

## 2017-02-06 MED ORDER — METOPROLOL TARTRATE 25 MG PO TABS
25.0000 mg | ORAL_TABLET | Freq: Two times a day (BID) | ORAL | Status: DC
  Administered 2017-02-06 – 2017-02-07 (×2): 25 mg via ORAL
  Filled 2017-02-06 (×3): qty 1

## 2017-02-06 MED ORDER — GI COCKTAIL ~~LOC~~: 30.0000 mL | Freq: Four times a day (QID) | ORAL | Status: DC | PRN

## 2017-02-06 MED ORDER — NITROGLYCERIN 0.4 MG SL SUBL
SUBLINGUAL_TABLET | SUBLINGUAL | Status: AC
  Filled 2017-02-06: qty 3

## 2017-02-06 MED ORDER — HEPARIN BOLUS VIA INFUSION
4000.0000 [IU] | Freq: Once | INTRAVENOUS | Status: AC
  Administered 2017-02-06: 4000 [IU] via INTRAVENOUS
  Filled 2017-02-06: qty 4000

## 2017-02-06 MED ORDER — MORPHINE SULFATE (PF) 4 MG/ML IV SOLN: 1.0000 mg | INTRAVENOUS | Status: DC | PRN

## 2017-02-06 MED ORDER — ROSUVASTATIN CALCIUM 20 MG PO TABS
40.0000 mg | ORAL_TABLET | Freq: Every day | ORAL | Status: DC
  Administered 2017-02-06: 40 mg via ORAL
  Filled 2017-02-06: qty 1
  Filled 2017-02-06: qty 2

## 2017-02-06 MED ORDER — SODIUM CHLORIDE 0.9 % WEIGHT BASED INFUSION
1.0000 mL/kg/h | INTRAVENOUS | Status: DC
  Administered 2017-02-07: 1 mL/kg/h via INTRAVENOUS

## 2017-02-06 MED ORDER — ASPIRIN 81 MG PO CHEW
81.0000 mg | CHEWABLE_TABLET | ORAL | Status: AC
  Administered 2017-02-07: 81 mg via ORAL
  Filled 2017-02-06: qty 1

## 2017-02-06 MED ORDER — NITROGLYCERIN 0.4 MG SL SUBL
0.4000 mg | SUBLINGUAL_TABLET | SUBLINGUAL | Status: AC | PRN
  Administered 2017-02-06 (×3): 0.4 mg via SUBLINGUAL

## 2017-02-06 MED ORDER — ALPRAZOLAM 0.25 MG PO TABS
0.2500 mg | ORAL_TABLET | Freq: Two times a day (BID) | ORAL | Status: DC | PRN
Start: 2017-02-06 — End: 2017-02-07

## 2017-02-06 MED ORDER — NITROGLYCERIN IN D5W 200-5 MCG/ML-% IV SOLN
0.0000 ug/min | Freq: Once | INTRAVENOUS | Status: AC
  Administered 2017-02-06: 5 ug/min via INTRAVENOUS
  Filled 2017-02-06: qty 250

## 2017-02-06 MED ORDER — SODIUM CHLORIDE 0.9 % WEIGHT BASED INFUSION
3.0000 mL/kg/h | INTRAVENOUS | Status: DC
  Administered 2017-02-07: 3 mL/kg/h via INTRAVENOUS

## 2017-02-06 NOTE — Progress Notes (Signed)
  Echocardiogram 2D Echocardiogram has been performed.  Delcie RochENNINGTON, Rosaire Cueto 02/06/2017, 5:43 PM

## 2017-02-06 NOTE — ED Triage Notes (Addendum)
Pt per EMS. Patient coming from work and sitting at desk when sudden substernal chest pressure. No vomiting or sob. Some dizziness and nausea. A/ox4. No radiation. No hx of cardiac issues, but family hx. 324 mg aspirin given. CP lasted about 10 mins then resolved. No pain now. Ems v/s 110/62, 70 HR, 95% RA. Cbg 211. Hx DM, HTN, hyperlip, current smoker. ekg SR. 20 LH.

## 2017-02-06 NOTE — Progress Notes (Signed)
ANTICOAGULATION CONSULT NOTE  Pharmacy Consult for heparin Indication: chest pain/ACS  Allergies  Allergen Reactions  . Other Shortness Of Breath  . Doxycycline Nausea And Vomiting  . Erythromycin Base Rash  . Sulfa Antibiotics Rash  . Prednisone     Heart pounding, rash     Patient Measurements: Height: 5\' 2"  (157.5 cm) Weight: 177 lb 7.5 oz (80.5 kg) IBW/kg (Calculated) : 50.1 Heparin Dosing Weight: 67.5kg  Vital Signs: Temp: 98.6 F (37 C) (06/05 1500) Temp Source: Oral (06/05 1500) BP: 102/62 (06/05 1915) Pulse Rate: 64 (06/05 1915)  Labs:  Recent Labs  02/06/17 1151 02/06/17 1406 02/06/17 2003  HGB 13.0  --   --   HCT 39.6  --   --   PLT 292  --   --   HEPARINUNFRC  --   --  0.38  CREATININE 0.96  --   --   TROPONINI  --  <0.03  --     Estimated Creatinine Clearance: 61.3 mL/min (by C-G formula based on SCr of 0.96 mg/dL).   Medical History: Past Medical History:  Diagnosis Date  . Anxiety   . Arthritis   . Asthma   . Bulging lumbar disc   . Chronic interstitial cystitis   . Depression   . Diabetes mellitus without complication (HCC)   . Disorder of left rotator cuff   . Fibromyalgia   . GERD (gastroesophageal reflux disease)   . HLD (hyperlipidemia)   . Hypertension   . Tobacco abuse   . Transaminitis     Medications:  Infusions:  . [START ON 02/07/2017] sodium chloride     Followed by  . [START ON 02/07/2017] sodium chloride    . heparin 900 Units/hr (02/06/17 1416)    Assessment: Terri Daniel presented to the ED with CP. To start IV heparin. Baseline CBC is WNL and pt is not on anticoagulation PTA.   Initial heparin level is therapeutic at 0.38 on heparin 900 units/hr. No issues with infusion or bleeding noted.   Goal of Therapy:  Heparin level 0.3-0.7 units/ml Monitor platelets by anticoagulation protocol: Yes   Plan:  Continue heparin 900 units/hr Daily heparin level and CBC Monitor s/sx of bleeding  Arlean Hoppingorey M. Newman PiesBall, PharmD,  BCPS Clinical Pharmacist [phone] 02/06/2017,8:45 PM

## 2017-02-06 NOTE — ED Notes (Signed)
Pt rbriefly states pain free after 2nd nitro then begins describing fullness  Feeling at left chest

## 2017-02-06 NOTE — Progress Notes (Signed)
ANTICOAGULATION CONSULT NOTE - Initial Consult  Pharmacy Consult for heparin Indication: chest pain/ACS  Allergies  Allergen Reactions  . Erythromycin Base Rash    Patient Measurements: Height: 5\' 2"  (157.5 cm) Weight: 174 lb 1.6 oz (79 kg) IBW/kg (Calculated) : 50.1 Heparin Dosing Weight: 67.5kg  Vital Signs: Temp: 98.1 F (36.7 C) (06/05 1142) Temp Source: Oral (06/05 1142) BP: 146/79 (06/05 1345) Pulse Rate: 83 (06/05 1345)  Labs:  Recent Labs  02/06/17 1151  HGB 13.0  HCT 39.6  PLT 292  CREATININE 0.96    Estimated Creatinine Clearance: 60.7 mL/min (by C-G formula based on SCr of 0.96 mg/dL).   Medical History: No past medical history on file.  Medications:  Infusions:  . heparin      Assessment: 60 yof presented to the ED with CP. To start IV heparin. Baseline CBC is WNL and pt is not on anticoagulation PTA.   Goal of Therapy:  Heparin level 0.3-0.7 units/ml Monitor platelets by anticoagulation protocol: Yes   Plan:  Heparin bolus 4000 units IV x 1 Heparin gtt 900 units/hr Check a 6 hr heparin level Daily heparin level and CBC  Forney Kleinpeter, Drake Leachachel Lynn 02/06/2017,1:56 PM

## 2017-02-06 NOTE — ED Notes (Signed)
Attempted to call report

## 2017-02-06 NOTE — ED Provider Notes (Signed)
[facility]-EMERGENCY DEPT Provider Note   CSN: [id number] Arrival date & time: 02/06/17  1131     History   Chief Complaint Chief Complaint  Patient presents with  . Chest Pain    HPI Terri Daniel is a 61 y.o. female. Complained of anterior chest pain nonradiating onset approximately 11 AM today described as pressure, waxes and wanes nonradiating. Not made better or worse by anything. Accompanying symptoms include nausea and shortness of breath and sweatiness. Pella discomfort is mild to moderate. EMS treated patient with aspirin prior to coming here. HPI  No past medical history on file. Past medical history hypercholesterolemia hypertension diabetes There are no active problems to display for this patient.   No past surgical history on file.  OB History    No data available       Home Medications    Prior to Admission medications   Not on File    Family History No family history on file. Family history Brother had MI age 944 Social History Social History  Substance Use Topics  . Smoking status: Not on file  . Smokeless tobacco: Not on file  . Alcohol use Not on file  Positive smoker occasional alcohol no illicit drug use   Allergies   Erythromycin base   Review of Systems Review of Systems  Constitutional: Positive for diaphoresis.  HENT: Negative.   Respiratory: Positive for shortness of breath.   Cardiovascular: Positive for chest pain.  Gastrointestinal: Positive for nausea.  Musculoskeletal: Negative.   Skin: Negative.   Allergic/Immunologic: Positive for immunocompromised state.       Diabetic  Neurological: Negative.   Psychiatric/Behavioral: Negative.   All other systems reviewed and are negative.    Physical Exam Updated Vital Signs BP 119/75   Pulse 73   Temp 98.1 F (36.7 C) (Oral)   Resp 12   Ht 5\' 2"  (1.575 m)   Wt 79 kg (174 lb 1.6 oz)   SpO2 93%   BMI 31.84 kg/m   Physical Exam  Constitutional: She appears well-developed  and well-nourished.  HENT:  Head: Normocephalic and atraumatic.  Eyes: Conjunctivae are normal. Pupils are equal, round, and reactive to light.  Neck: Neck supple. No tracheal deviation present. No thyromegaly present.  Cardiovascular: Normal rate and regular rhythm.   No murmur heard. Pulmonary/Chest: Effort normal and breath sounds normal.  Abdominal: Soft. Bowel sounds are normal. She exhibits no distension. There is no tenderness.  Musculoskeletal: Normal range of motion. She exhibits no edema or tenderness.  Neurological: She is alert. Coordination normal.  Skin: Skin is warm and dry. No rash noted.  Psychiatric: She has a normal mood and affect.  Nursing note and vitals reviewed.    ED Treatments / Results  Labs (all labs ordered are listed, but only abnormal results are displayed) Labs Reviewed  BASIC METABOLIC PANEL - Abnormal; Notable for the following:       Result Value   CO2 20 (*)    Glucose, Bld 167 (*)    All other components within normal limits  CBC - Abnormal; Notable for the following:    WBC 13.0 (*)    All other components within normal limits  I-STAT TROPOININ, ED   Results for orders placed or performed during the hospital encounter of 02/06/17  Basic metabolic panel  Result Value Ref Range   Sodium 136 135 - 145 mmol/L   Potassium 3.8 3.5 - 5.1 mmol/L   Chloride 105 101 - 111 mmol/L  CO2 20 (L) 22 - 32 mmol/L   Glucose, Bld 167 (H) 65 - 99 mg/dL   BUN 15 6 - 20 mg/dL   Creatinine, Ser 1.61 0.44 - 1.00 mg/dL   Calcium 9.2 8.9 - 09.6 mg/dL   GFR calc non Af Amer >60 >60 mL/min   GFR calc Af Amer >60 >60 mL/min   Anion gap 11 5 - 15  CBC  Result Value Ref Range   WBC 13.0 (H) 4.0 - 10.5 K/uL   RBC 4.42 3.87 - 5.11 MIL/uL   Hemoglobin 13.0 12.0 - 15.0 g/dL   HCT 04.5 40.9 - 81.1 %   MCV 89.6 78.0 - 100.0 fL   MCH 29.4 26.0 - 34.0 pg   MCHC 32.8 30.0 - 36.0 g/dL   RDW 91.4 78.2 - 95.6 %   Platelets 292 150 - 400 K/uL  Urinalysis, Routine w  reflex microscopic  Result Value Ref Range   Color, Urine STRAW (A) YELLOW   APPearance CLEAR CLEAR   Specific Gravity, Urine 1.020 1.005 - 1.030   pH 5.0 5.0 - 8.0   Glucose, UA >=500 (A) NEGATIVE mg/dL   Hgb urine dipstick NEGATIVE NEGATIVE   Bilirubin Urine NEGATIVE NEGATIVE   Ketones, ur NEGATIVE NEGATIVE mg/dL   Protein, ur NEGATIVE NEGATIVE mg/dL   Nitrite NEGATIVE NEGATIVE   Leukocytes, UA NEGATIVE NEGATIVE   RBC / HPF 0-5 0 - 5 RBC/hpf   WBC, UA 0-5 0 - 5 WBC/hpf   Bacteria, UA NONE SEEN NONE SEEN   Squamous Epithelial / LPF 0-5 (A) NONE SEEN  I-stat troponin, ED  Result Value Ref Range   Troponin i, poc 0.00 0.00 - 0.08 ng/mL   Comment 3           Dg Chest Portable 1 View  Result Date: 02/06/2017 CLINICAL DATA:  Chest pain. EXAM: PORTABLE CHEST 1 VIEW COMPARISON:  Radiographs of October 06, 2015. FINDINGS: The heart size and mediastinal contours are within normal limits. Both lungs are clear. No pneumothorax or pleural effusion is noted. The visualized skeletal structures are unremarkable. IMPRESSION: No acute cardiopulmonary abnormality seen. Electronically Signed   By: Lupita Raider, M.D.   On: 02/06/2017 12:49   EKG  EKG Interpretation  Date/Time:  Tuesday February 06 2017 11:40:09 EDT Ventricular Rate:  80 PR Interval:    QRS Duration: 73 QT Interval:  496 QTC Calculation: 573 R Axis:   -20 Text Interpretation:  Sinus rhythm Borderline left axis deviation Low voltage, precordial leads Borderline T abnormalities, lateral leads Prolonged QT interval No significant change since last tracing Confirmed by Doug Sou [phone]) on 02/06/2017 12:00:19 PM       Radiology Dg Chest Portable 1 View  Result Date: 02/06/2017 CLINICAL DATA:  Chest pain. EXAM: PORTABLE CHEST 1 VIEW COMPARISON:  Radiographs of October 06, 2015. FINDINGS: The heart size and mediastinal contours are within normal limits. Both lungs are clear. No pneumothorax or pleural effusion is noted. The  visualized skeletal structures are unremarkable. IMPRESSION: No acute cardiopulmonary abnormality seen. Electronically Signed   By: Lupita Raider, M.D.   On: 02/06/2017 12:49  Chest x-ray viewed by me  Procedures Procedures (including critical care time)  Medications Ordered in ED Medications  nitroGLYCERIN (NITROSTAT) 0.4 MG SL tablet (not administered)  nitroGLYCERIN (NITROSTAT) SL tablet 0.4 mg (0.4 mg Sublingual Given 02/06/17 1256)     Initial Impression / Assessment and Plan / ED Course  I have reviewed the triage vital signs  and the nursing notes.  Pertinent labs & imaging results that were available during my care of the patient were reviewed by me and considered in my medical decision making (see chart for details).     1:05 PM Patient had transient relief after nitroglycerin sublingual 3 tablets. However she does continue to have some pressure in her left anterior chest. IV nitroglycerin drip ordered to titrate the pain 2:20 PM pain almost gone after treatment with nitroglycerin intravenous drip. Hospitalist service consulted to arrange for admission Final Clinical Impressions(s) / ED Diagnoses   Final diagnoses:  None   Diagnosis#1 unstable angina #2 hyperglycemia CRITICAL CARE Performed by: Doug Sou Total critical care time: 30 minutes Critical care time was exclusive of separately billable procedures and treating other patients. Critical care was necessary to treat or prevent imminent or life-threatening deterioration. Critical care was time spent personally by me on the following activities: development of treatment plan with patient and/or surrogate as well as nursing, discussions with consultants, evaluation of patient's response to treatment, examination of patient, obtaining history from patient or surrogate, ordering and performing treatments and interventions, ordering and review of laboratory studies, ordering and review of radiographic studies, pulse  oximetry and re-evaluation of patient's condition. New Prescriptions New Prescriptions   No medications on file     Doug Sou, MD 02/06/17 1426

## 2017-02-06 NOTE — H&P (Signed)
History and Physical    Terri Daniel ZOX:[id number] DOB: [birth year] DOA: 02/06/2017   PCP: Clayborn Heron, MD   Patient coming from/Resides with: Private residence  Admission status: Observation/SDU -it may be medically necessary to stay a minimum 2 midnights to rule out impending and/or unexpected changes in physiologic status that may differ from initial evaluation performed in the ER and/or at time of admission-consider reevaluation of admission status in 24 hours.   Chief Complaint: Chest pain  HPI: Terri Daniel is a 61 y.o. female with medical history significant for diabetes mellitus on oral agents, asthma, hypertension, ongoing tobacco abuse, obesity, left rotator cuff dysfunction, chronic interstitial cystitis successfully treated with Botox injection, fibromyalgia and dyslipidemia. Patient reports that while seated at work today around 11 AM she suddenly developed chest discomfort described as "an elephant sitting on my chest". This discomfort was unrelenting. She became diaphoretic and short of breath. Earlier in the morning before going to work she just "felt bad" and felt nauseous and dizzy. She also reports nonspecific right-sided similar chest pressure last week and over this past weekend. EMS was called to her work and she was given an aspirin. She has subsequently been given 3 nitroglycerin SL in the ER with pain decreasing from 10/10-6/10 and eventually down to 2/10 but has continued to wax and wane and has never been completely resolved. She was started on nitroglycerin infusion by the ER physician. She was also started on oxygen. Her EKG shows very subtle nonspecific ST segment downsloping in inferior lateral leads most prominent in V4 and V5.  Patient reports dating back to 2008 and again in 2011 she has been evaluated by cardiologist Dr. Josefa Half in [place], [place] for chest discomfort. In 2008 she underwent a Myoview stress test that did not demonstrate  any evidence of cardiac ischemia. She reports that her current chest pain symptoms are different from the symptoms she was having during this previous cardiac evaluations.  ED Course:  Vital Signs: BP (!) 146/79   Pulse 83   Temp 98.1 F (36.7 C) (Oral)   Resp 19   Ht 5\' 2"  (1.575 m)   Wt 79 kg (174 lb 1.6 oz)   SpO2 98%   BMI 31.84 kg/m  PCXR: Neg Lab data: Sodium 136, potassium 3.8, chloride 15, CO2 20, glucose 167, BUN 15, creatinine 0.96, calcium 9.2, anion gap 11, poc troponin 0.00, white count 13,000 differential not obtained, hemoglobin 13, platelets 292,000 Medications and treatments: Aspirin 325 mg 1, SL NTG 0.4 mg 3, nitroglycerin infusion  Review of Systems:  In addition to the HPI above,  No Fever-chills, myalgias or other constitutional symptoms No Headache, changes with Vision or hearing, new weakness, tingling, numbness in any extremity, dizziness, dysarthria or word finding difficulty, gait disturbance or imbalance, tremors or seizure activity No problems swallowing food or Liquids, indigestion/reflux, choking or coughing with eating No Cough, palpitations, orthopnea  No Abdominal pain, emesis, melena,hematochezia, dark tarry stools, constipation No dysuria, malodorous urine, hematuria or flank pain No new skin rashes, lesions, masses or bruises, No new joint pains, aches, swelling or redness No recent unintentional weight gain or loss No polyuria, polydypsia or polyphagia   Past Medical History:  Diagnosis Date  . Anxiety   . Arthritis   . Asthma   . Chronic interstitial cystitis   . Depression   . Diabetes mellitus without complication (HCC)   . Disorder of left rotator cuff   . Fibromyalgia   .  GERD (gastroesophageal reflux disease)   . HLD (hyperlipidemia)   . Hypertension   . Transaminitis     Past Surgical History:  Procedure Laterality Date  . CYSTOSCOPY    . JOINT REPLACEMENT Left 2009   Knee    Social History   Social History  .  Marital status: Divorced    Spouse name: N/A  . Number of children: N/A  . Years of education: N/A   Occupational History  . Not on file.   Social History Main Topics  . Smoking status: Current Every Day Smoker    Packs/day: 0.50    Types: Cigarettes  . Smokeless tobacco: Not on file  . Alcohol use No  . Drug use: No  . Sexual activity: Not on file   Other Topics Concern  . Not on file   Social History Narrative  . No narrative on file    Mobility: Independent Work history: Office work   Allergies  Allergen Reactions  . Erythromycin Base Rash    Family History  Problem Relation Age of Onset  . CAD Mother   . CAD Father   . CAD Brother   . Diabetes Mellitus II Brother      Prior to Admission medications   Not on File    Physical Exam: Vitals:   02/06/17 1315 02/06/17 1330 02/06/17 1345 02/06/17 1400  BP: 119/69 122/68 (!) 146/79 117/71  Pulse: 83 84 83 87  Resp: 15 (!) 21 19 (!) 24  Temp:      TempSrc:      SpO2: 97% 97% 98% 98%  Weight:      Height:          Constitutional: NAD, Mildly anxious, uncomfortable 2/2 reports of bilateral lower extremity cramping Eyes: PERRL, lids and conjunctivae normal ENMT: Mucous membranes are moist. Posterior pharynx clear of any exudate or lesions.Normal dentition.  Neck: normal, supple, no masses, no thyromegaly Respiratory: clear to auscultation bilaterally, no wheezing, no crackles. Normal respiratory effort. No accessory muscle use. Very minimal supra clavicle discomfort with palpation anterior chest wall Cardiovascular: Regular rate and rhythm, no murmurs / rubs / gallops. No extremity edema. 2+ pedal pulses. No carotid bruits.  Abdomen: no tenderness, no masses palpated. No hepatosplenomegaly. Bowel sounds positive.  Musculoskeletal: no clubbing / cyanosis. No joint deformity upper and lower extremities. Good ROM, no contractures. Normal muscle tone.  Skin: no rashes, lesions, ulcers. No  induration Neurologic: CN 2-12 grossly intact. Sensation intact, DTR normal. Strength 5/5 x all 4 extremities.  Psychiatric: Normal judgment and insight. Alert and oriented x 3. Mildly anxious mood.    Labs on Admission: I have personally reviewed following labs and imaging studies  CBC:  Recent Labs Lab 02/06/17 1151  WBC 13.0*  HGB 13.0  HCT 39.6  MCV 89.6  PLT 292   Basic Metabolic Panel:  Recent Labs Lab 02/06/17 1151  NA 136  K 3.8  CL 105  CO2 20*  GLUCOSE 167*  BUN 15  CREATININE 0.96  CALCIUM 9.2   GFR: Estimated Creatinine Clearance: 60.7 mL/min (by C-G formula based on SCr of 0.96 mg/dL). Liver Function Tests: No results for input(s): AST, ALT, ALKPHOS, BILITOT, PROT, ALBUMIN in the last 168 hours. No results for input(s): LIPASE, AMYLASE in the last 168 hours. No results for input(s): AMMONIA in the last 168 hours. Coagulation Profile: No results for input(s): INR, PROTIME in the last 168 hours. Cardiac Enzymes: No results for input(s): CKTOTAL, CKMB, CKMBINDEX, TROPONINI in  the last 168 hours. BNP (last 3 results) No results for input(s): PROBNP in the last 8760 hours. HbA1C: No results for input(s): HGBA1C in the last 72 hours. CBG: No results for input(s): GLUCAP in the last 168 hours. Lipid Profile: No results for input(s): CHOL, HDL, LDLCALC, TRIG, CHOLHDL, LDLDIRECT in the last 72 hours. Thyroid Function Tests: No results for input(s): TSH, T4TOTAL, FREET4, T3FREE, THYROIDAB in the last 72 hours. Anemia Panel: No results for input(s): VITAMINB12, FOLATE, FERRITIN, TIBC, IRON, RETICCTPCT in the last 72 hours. Urine analysis:    Component Value Date/Time   COLORURINE STRAW (A) 02/06/2017 1345   APPEARANCEUR CLEAR 02/06/2017 1345   LABSPEC 1.020 02/06/2017 1345   PHURINE 5.0 02/06/2017 1345   GLUCOSEU >=500 (A) 02/06/2017 1345   HGBUR NEGATIVE 02/06/2017 1345   BILIRUBINUR NEGATIVE 02/06/2017 1345   KETONESUR NEGATIVE 02/06/2017 1345    PROTEINUR NEGATIVE 02/06/2017 1345   UROBILINOGEN 0.2 08/04/2008 1443   NITRITE NEGATIVE 02/06/2017 1345   LEUKOCYTESUR NEGATIVE 02/06/2017 1345   Sepsis Labs: @LABRCNTIP (procalcitonin:4,lacticidven:4) )No results found for this or any previous visit (from the past 240 hour(s)).   Radiological Exams on Admission: Dg Chest Portable 1 View  Result Date: 02/06/2017 CLINICAL DATA:  Chest pain. EXAM: PORTABLE CHEST 1 VIEW COMPARISON:  Radiographs of October 06, 2015. FINDINGS: The heart size and mediastinal contours are within normal limits. Both lungs are clear. No pneumothorax or pleural effusion is noted. The visualized skeletal structures are unremarkable. IMPRESSION: No acute cardiopulmonary abnormality seen. Electronically Signed   By: Lupita RaiderJames  Green Jr, M.D.   On: 02/06/2017 12:49    EKG: (Independently reviewed) Sinus rhythm with ventricular rate 80 bpm, QTC 573 ms, subtle downsloping ST segments in leads 1 to V4 and V5 and to a lesser degree V6, flattened out ST segments in leads 3 and aVF, appropriate R-wave transition although R waves are low voltage  Assessment/Plan Principal Problem:   Chest pain -Patient presents with typical chest discomfort with associated diaphoresis, shortness of breath nausea and dizziness with subtle EKG changes -Patient has had improvement and but not resolution of chest pain with combination of nitroglycerin and aspirin with current chest pain level 2/10 on nitroglycerin infusion -Consult cardiology -Begin heparin infusion -Begin beta blocker -Continue aspirin -Supplemental oxygen -IV MSO4 prn CP -Cycle troponin-initial poc troponin negative -Echocardiogram -Anticipate at a minimum Myoview stress test during this admission although anticipate will undergo cardiac catheterization-this will be at discretion of cardiology team -Multiple risk factors as follows: Postmenopausal, obese, diabetes, hypertension, dyslipidemia, tobacco abuse, extensive family  history with brother, father and mother with MI /CAD history  Active Problems:   HTN (hypertension) -Current blood pressure controlled on nitroglycerin infusion -On lisinopril with hydrochlorothiazide and clonidine at home    Diabetes mellitus type 2 in obese  -Januvia at home -SSI for now -HgbA1c    Leukocytosis -Possibly reflective of coronary ischemia -See below regarding chronic interstitial cystitis    HLD (hyperlipidemia) -Meds have not been reconciled by pharmacy -Based on care everywhere documentation patient was taking Crestor 40 mg daily at home -Obtain lipid panel    Fibromyalgia -Patient takes Prozac and is no longer on narcotics or other chronic pain medications    Chronic interstitial cystitis -Underwent successful Botox injection earlier in the year and no longer requires chronic pain medications -She is not on antimicrobial suppressive therapy -Given leukocytosis we'll check urinalysis and culture for completeness of evaluation    Leg cramps -Check magnesium level -Potassium 3.8 and patient  is on hydrochlorothiazide prior to admission-may benefit from keeping potassium greater than 4.0    Obesity (BMI 30.0-34.9) -Discussed weight reduction with patient -Weight reduction strategies to be addressed by new PCP Dr. Luciana Axe    Rotator cuff dysfunction, left -Patient reports asymptomatic at this juncture and current chest discomfort not precipitated by movement in left arm    Right hand contractures with numbness -New issues evaluated by orthopedic team on 6/4 with recommendation for outpatient cervical spine imaging    Asthma -Resume preadmission MDIs once medications reconciled      DVT prophylaxis: Heparin infusion Code Status: Full  Family Communication: Family member at bedside with patient permission  Disposition Plan: Home Consults called: Cardiology/[facility] on-call     Russella Dar ANP-BC [facility] Pager [phone]   If  7PM-7AM, please contact night-coverage [email or web address] Password [email or web address]  02/06/2017, 2:10 PM

## 2017-02-06 NOTE — Consult Note (Signed)
The patient has been seen in conjunction with Lucile Crater, PA-C. All aspects of care have been considered and discussed. The patient has been personally interviewed, examined, and all clinical data has been reviewed.   Compelling history of one severe episode of ischemia quality chest discomfort. To this point no objective data to pinpoint myocardial ischemia. Multiple risk factors including family history, tobacco, hypertension, diabetes and high stress levels.  Clinical presentation is that of unstable angina pectoris and possible non-ST elevation MI.  Agree with IV nitroglycerin and heparin. Cycle cardiac markers. Repeat ECG if recurrent pain and also in a.m.  We have recommended coronary angiography as as the most at right means of excluding critical coronary disease given her family history. The procedure and risks of stroke, death, myocardial infarction, bleeding, contrast allergy, kidney injury, among others was discussed in detail with patient and accepted. Plan to proceed with catheterization in the a.m.    Cardiology Consultation:   Patient ID: Terri Daniel; [id number]; [birth year]   Admit date: 02/06/2017 Date of Consult: 02/06/2017  Primary Care Provider: Clayborn Heron, MD Primary Cardiologist: New to Dr. Katrinka Blazing   Patient Profile:   Terri Daniel is a 61 y.o. female with a hx of ongoing tobacco abuse, DM, HTN, HLD, GERD, fibromyalgia, anxiety, arthritis, asthma, depression, chronic interstitial cystitis whom we are asked to see for chest pain by Dr. Delsa Bern.  History of Present Illness:   She was remotely seen by cardiology in [place] in 2008 and 2011 for chest pain with negative stress testing. She was diagnosed with costochondritis. She also has history of fibromyalgia and chronic back pain. Diabetes was diagnosed last year. She's smoked on-off for a total of 27 years.  About 2 weeks ago she developed R arm numbness associated with contractures in her 3, 4, 5th  fingers. She saw orthopedics and was put on prednisone without significant relief. She did feel this made her heart pound harder and said the pharmacist noticed a rash. She f/u with ortho yesterday at which time her grip strength was further reduced and finger abduction was not strong, prompting upcoming cervical imaging scheduled for this weekend. Last week one day randomly she was watching TV and had 30 minutes of right sided chest discomfort, no associated sx, resolved without intervention - happened again over this past weekend. This morning when she woke up she just didn't feel good. She was feeling dizzy and nauseated. She went on to work where suddenly while sitting at her desk she developed chest pressure described like a balloon inflating in her chest/elephant sitting on her chest, associated with SOB and diaphoresis. She called EMS and went and laid down. When EMS arrived they had her sit up and put 2 pillows behind her back and discomfort resolved. However, it subsequently returned and in the ER required 3 successive doses of NTG - felt worse after the first dose, then gradually better after the latter 2. She is on a NTG gtt and currently chest pain free. The discomfort was not worse with inspiration or palpation. She has never had anything like this before ever. She did feel jaw pain during one of the recurrences earlier. She also reports feeling leg cramps and headache last night, intermittent diarrhea then constipation, and teeth pain. Labs notable for CBG 167, troponin neg x1, WBC 13k, UA >500 glucose, CXR NAD. EKG NSR with nonspecific changes. She received 324mg  of aspirin by EMS. + Fam hx of CAD - "widow maker" MI in  her brother before age 55 requiring stent (then repeat stenting for what sounds like ISR), CABG in father age 55, and stenting in mother in her 31s.    Past Medical History:  Diagnosis Date  . Anxiety   . Arthritis   . Asthma   . Bulging lumbar disc   . Chronic interstitial  cystitis   . Depression   . Diabetes mellitus without complication (HCC)   . Disorder of left rotator cuff   . Fibromyalgia   . GERD (gastroesophageal reflux disease)   . HLD (hyperlipidemia)   . Hypertension   . Tobacco abuse   . Transaminitis     Past Surgical History:  Procedure Laterality Date  . CYSTOSCOPY    . JOINT REPLACEMENT Left 2009   Knee    Outpatient Meds - not yet reconciled - lisinopril HCTZ, clonidine, Prozac, Invokana, Januvia, Crestor (not yet started), Zyrtec, MVI, Biotin, inhaler (Symbicort), hydroxyzine  Inpatient Medications: Scheduled Meds: . insulin aspart  0-5 Units Subcutaneous QHS  . insulin aspart  0-9 Units Subcutaneous TID WC  . nitroGLYCERIN       Continuous Infusions: . heparin 900 Units/hr (02/06/17 1416)   PRN Meds:   Allergies:    Allergies  Allergen Reactions  . Erythromycin Base Rash    Social History:   Social History   Social History  . Marital status: Divorced    Spouse name: N/A  . Number of children: N/A  . Years of education: N/A   Occupational History  . Not on file.   Social History Main Topics  . Smoking status: Current Every Day Smoker    Packs/day: 0.50    Types: Cigarettes  . Smokeless tobacco: Not on file     Comment: 27 years total as of 2018  . Alcohol use No  . Drug use: No  . Sexual activity: Not on file   Other Topics Concern  . Not on file   Social History Narrative  . No narrative on file    Family History:   The patient's family history includes Atrial fibrillation in her mother; CAD in her brother, father, and mother; Diabetes Mellitus II in her brother.  ROS:  Please see the history of present illness. No vomiting, weight change, LEE. All other ROS reviewed and negative.     Physical Exam/Data:   Vitals:   02/06/17 1315 02/06/17 1330 02/06/17 1345 02/06/17 1400  BP: 119/69 122/68 (!) 146/79 117/71  Pulse: 83 84 83 87  Resp: 15 (!) 21 19 (!) 24  Temp:      TempSrc:      SpO2:  97% 97% 98% 98%  Weight:      Height:       No intake or output data in the 24 hours ending 02/06/17 1437 Filed Weights   02/06/17 1142  Weight: 174 lb 1.6 oz (79 kg)   Body mass index is 31.84 kg/m.  General: Well developed, well nourished obese WF in no acute distress. Head: Normocephalic, atraumatic, sclera non-icteric, no xanthomas, nares are without discharges Neck: Negative for carotid bruits. JVD not elevated. Lungs: Clear bilaterally to auscultation without wheezes, rales, or rhonchi. Breathing is unlabored. Heart: RRR with S1 S2. No murmurs, rubs, or gallops appreciated. Abdomen: Soft, non-tender, non-distended with normoactive bowel sounds. No hepatomegaly. No rebound/guarding. No obvious abdominal masses. Msk:  Strength and tone appear normal for age. Extremities: No clubbing or cyanosis. No edema.  Distal pedal pulses are 2+ and equal bilaterally. Neuro: Alert  and oriented X 3. No facial asymmetry. No focal deficit. Moves all extremities spontaneously. Psych:  Responds to questions appropriately with a normal affect.  EKG:  The EKG was personally reviewed and demonstrates NSR 80bpm, borderline left axis deviation, nonspecific ST-T changes with TW flattening II, III, avF, V1-V3, subtle TWI V4-V6, QTc 573ms? (TW difficult to discern) -> QT by EMS EKG was 390 and 460ms   Relevant CV Studies: None for review  Laboratory Data:  Chemistry Recent Labs Lab 02/06/17 1151  NA 136  K 3.8  CL 105  CO2 20*  GLUCOSE 167*  BUN 15  CREATININE 0.96  CALCIUM 9.2  GFRNONAA >60  GFRAA >60  ANIONGAP 11    No results for input(s): PROT, ALBUMIN, AST, ALT, ALKPHOS, BILITOT in the last 168 hours. Hematology Recent Labs Lab 02/06/17 1151  WBC 13.0*  RBC 4.42  HGB 13.0  HCT 39.6  MCV 89.6  MCH 29.4  MCHC 32.8  RDW 13.7  PLT 292    Recent Labs Lab 02/06/17 1217  TROPIPOC 0.00     Radiology/Studies:  Dg Chest Portable 1 View  Result Date: 02/06/2017 CLINICAL DATA:   Chest pain. EXAM: PORTABLE CHEST 1 VIEW COMPARISON:  Radiographs of October 06, 2015. FINDINGS: The heart size and mediastinal contours are within normal limits. Both lungs are clear. No pneumothorax or pleural effusion is noted. The visualized skeletal structures are unremarkable. IMPRESSION: No acute cardiopulmonary abnormality seen. Electronically Signed   By: Lupita RaiderJames  Green Jr, M.D.   On: 02/06/2017 12:49    Assessment and Plan:   89F with ongoing tobacco abuse, DM, HTN, HLD, GERD, fibromyalgia, anxiety, arthritis, asthma, depression, chronic interstitial cystitis whom we are asked to see for chest pain by Dr. Delsa BernJacobowicz. Has been recently undergoing workup by orthopedics for R arm numbness (constant) and contractures of fingers on right hand. Had 2 episodes of CP last week, then persistent severe discomfort today prompting admission.  1. Chest discomfort concerning for unstable angina - multiple risk factors including tobacco abuse, DM, HTN, HLD (pt reports she's not yet started Crestor) and family history of CAD. Clinical history has both mixed typical and atypical features. Clinical picture somewhat clouded by multitude of other non-cardiac symptoms including leg cramps, headache, tooth pain. EKG is abnormal with nonspecific changes and no prior available to compare to. Initial troponin is negative. Will review further with Dr. Katrinka BlazingSmith with regards to ischemic workup.  2. HTN - pending home med rec. Per H/P, IM plans to initiate beta blocker.  3. Ongoing tobacco abuse - counseled on dangers of continued use. Cessation advised.  4. Hyperlipidemia - pending lipid panel and LFTs. Patient reports she had not started Crestor yet. Will re-initiate in the hospital.  5. Diabetes mellitus - per IM.  6. Ongoing tobacco abuse - counseled on dangers of continued use. Cessation advised.  7. Right hand numbness/contracture - per patient, orthopedics suspected cervical spine issue and was pending outpatient  imaging. Will defer further management to primary team. Would consider avoiding right radial access so we don't complicate the clinical picture with this.  Signed, Laurann Montanaayna N Dunn, PA-C  02/06/2017 2:37 PM

## 2017-02-07 ENCOUNTER — Encounter (HOSPITAL_COMMUNITY)
Admission: EM | Disposition: A | Discharge status: Home / Self Care | Payer: Self-pay | Source: Home / Self Care | Attending: Family Medicine

## 2017-02-07 DIAGNOSIS — R0789 Other chest pain: Secondary | ICD-10-CM

## 2017-02-07 DIAGNOSIS — R079 Chest pain, unspecified: Secondary | ICD-10-CM

## 2017-02-07 DIAGNOSIS — I200 Unstable angina: Secondary | ICD-10-CM

## 2017-02-07 DIAGNOSIS — K219 Gastro-esophageal reflux disease without esophagitis: Secondary | ICD-10-CM

## 2017-02-07 HISTORY — PX: LEFT HEART CATH AND CORONARY ANGIOGRAPHY: CATH118249

## 2017-02-07 LAB — BASIC METABOLIC PANEL
Anion gap: 11 (ref 5–15)
BUN: 16 mg/dL (ref 6–20)
CO2: 22 mmol/L (ref 22–32)
CREATININE: 0.97 mg/dL (ref 0.44–1.00)
Calcium: 9.1 mg/dL (ref 8.9–10.3)
Chloride: 101 mmol/L (ref 101–111)
GFR calc Af Amer: >60 mL/min (ref >60)
Glucose, Bld: 243 mg/dL — ABNORMAL HIGH (ref 65–99)
Potassium: 3.5 mmol/L (ref 3.5–5.1)
SODIUM: 134 mmol/L — AB (ref 135–145)

## 2017-02-07 LAB — CBC
HCT: 37.3 % (ref 36.0–46.0)
Hemoglobin: 12.1 g/dL (ref 12.0–15.0)
MCH: 29.3 pg (ref 26.0–34.0)
MCHC: 32.4 g/dL (ref 30.0–36.0)
MCV: 90.3 fL (ref 78.0–100.0)
PLATELETS: 284 10*3/uL (ref 150–400)
RBC: 4.13 MIL/uL (ref 3.87–5.11)
RDW: 13.7 % (ref 11.5–15.5)
WBC: 12.6 10*3/uL — ABNORMAL HIGH (ref 4.0–10.5)

## 2017-02-07 LAB — TROPONIN I: Troponin I: <0.03 ng/mL (ref <0.03)

## 2017-02-07 LAB — ECHOCARDIOGRAM COMPLETE
CHL CUP DOP CALC LVOT VTI: 24 cm
CHL CUP MV DEC (S): 243
EERAT: 11.5
EWDT: 243 ms
FS: 30 % (ref 28–44)
Height: 62 in
IVS/LV PW RATIO, ED: 0.91
LA ID, A-P, ES: 32 mm
LA diam end sys: 32 mm
LA vol index: 15.7 mL/m2
LADIAMINDEX: 1.68 cm/m2
LAVOL: 29.9 mL
LAVOLA4C: 32.8 mL
LV E/e' medial: 11.5
LV e' LATERAL: 6.75 cm/s
LVEEAVG: 11.5
LVOT area: 2.84 cm2
LVOT diameter: 19 mm
LVOT peak grad rest: 5 mmHg
LVOT peak vel: 116 cm/s
LVOTSV: 68 mL
MV Peak grad: 2 mmHg
MVPKAVEL: 80 m/s
MVPKEVEL: 77.6 m/s
PW: 10.4 mm — AB (ref 0.6–1.1)
RV LATERAL S' VELOCITY: 16.8 cm/s
TAPSE: 21.2 mm
TDI e' lateral: 6.75
TDI e' medial: 6.25
Weight: 2839.52 oz

## 2017-02-07 LAB — HEMOGLOBIN A1C
Hgb A1c MFr Bld: 7.7 % — ABNORMAL HIGH (ref 4.8–5.6)
MEAN PLASMA GLUCOSE: 174 mg/dL

## 2017-02-07 LAB — HEPARIN LEVEL (UNFRACTIONATED): Heparin Unfractionated: 0.32 IU/mL (ref 0.30–0.70)

## 2017-02-07 LAB — URINE CULTURE: Culture: <10000 — AB

## 2017-02-07 LAB — PROTIME-INR
INR: 1.01
Prothrombin Time: 13.3 seconds (ref 11.4–15.2)

## 2017-02-07 LAB — HIV ANTIBODY (ROUTINE TESTING W REFLEX): HIV Screen 4th Generation wRfx: NONREACTIVE

## 2017-02-07 LAB — GLUCOSE, CAPILLARY: Glucose-Capillary: 186 mg/dL — ABNORMAL HIGH (ref 65–99)

## 2017-02-07 SURGERY — LEFT HEART CATH AND CORONARY ANGIOGRAPHY
Anesthesia: LOCAL

## 2017-02-07 MED ORDER — METHYLPREDNISOLONE SODIUM SUCC 125 MG IJ SOLR
INTRAMUSCULAR | Status: AC
  Filled 2017-02-07: qty 2

## 2017-02-07 MED ORDER — HEPARIN (PORCINE) IN NACL 2-0.9 UNIT/ML-% IJ SOLN
INTRAMUSCULAR | Status: AC
  Filled 2017-02-07: qty 1000

## 2017-02-07 MED ORDER — IOPAMIDOL (ISOVUE-370) INJECTION 76%
INTRAVENOUS | Status: AC
  Filled 2017-02-07: qty 100

## 2017-02-07 MED ORDER — LIDOCAINE HCL (PF) 1 % IJ SOLN
INTRAMUSCULAR | Status: AC
  Filled 2017-02-07: qty 30

## 2017-02-07 MED ORDER — HEPARIN SODIUM (PORCINE) 1000 UNIT/ML IJ SOLN
INTRAMUSCULAR | Status: AC
  Filled 2017-02-07: qty 1

## 2017-02-07 MED ORDER — DIPHENHYDRAMINE HCL 50 MG/ML IJ SOLN
25.0000 mg | Freq: Once | INTRAMUSCULAR | Status: AC
  Administered 2017-02-07: 25 mg via INTRAVENOUS
  Filled 2017-02-07: qty 1

## 2017-02-07 MED ORDER — VERAPAMIL HCL 2.5 MG/ML IV SOLN
INTRAVENOUS | Status: DC | PRN
  Administered 2017-02-07: 09:00:00 via INTRA_ARTERIAL

## 2017-02-07 MED ORDER — SODIUM CHLORIDE 0.9 % IV SOLN: 250.0000 mL | INTRAVENOUS | Status: DC | PRN

## 2017-02-07 MED ORDER — IOPAMIDOL (ISOVUE-370) INJECTION 76%
INTRAVENOUS | Status: DC | PRN
  Administered 2017-02-07: 80 mL via INTRA_ARTERIAL

## 2017-02-07 MED ORDER — MIDAZOLAM HCL 2 MG/2ML IJ SOLN
INTRAMUSCULAR | Status: AC
  Filled 2017-02-07: qty 2

## 2017-02-07 MED ORDER — ONDANSETRON HCL 4 MG/2ML IJ SOLN: 4.0000 mg | Freq: Four times a day (QID) | INTRAMUSCULAR | Status: DC | PRN

## 2017-02-07 MED ORDER — FENTANYL CITRATE (PF) 100 MCG/2ML IJ SOLN
INTRAMUSCULAR | Status: DC | PRN
  Administered 2017-02-07: 50 ug via INTRAVENOUS

## 2017-02-07 MED ORDER — MIDAZOLAM HCL 2 MG/2ML IJ SOLN
INTRAMUSCULAR | Status: DC | PRN
  Administered 2017-02-07: 2 mg via INTRAVENOUS

## 2017-02-07 MED ORDER — PANTOPRAZOLE SODIUM 40 MG PO TBEC: 40.0000 mg | DELAYED_RELEASE_TABLET | Freq: Every day | ORAL | Status: DC

## 2017-02-07 MED ORDER — LIDOCAINE HCL (PF) 1 % IJ SOLN
INTRAMUSCULAR | Status: DC | PRN
  Administered 2017-02-07: 2 mL

## 2017-02-07 MED ORDER — METHYLPREDNISOLONE SODIUM SUCC 125 MG IJ SOLR
INTRAMUSCULAR | Status: DC | PRN
  Administered 2017-02-07: 125 mg via INTRAVENOUS

## 2017-02-07 MED ORDER — VERAPAMIL HCL 2.5 MG/ML IV SOLN
INTRAVENOUS | Status: AC
  Filled 2017-02-07: qty 2

## 2017-02-07 MED ORDER — FENTANYL CITRATE (PF) 100 MCG/2ML IJ SOLN
INTRAMUSCULAR | Status: AC
  Filled 2017-02-07: qty 2

## 2017-02-07 MED ORDER — SODIUM CHLORIDE 0.9% FLUSH: 3.0000 mL | INTRAVENOUS | Status: DC | PRN

## 2017-02-07 MED ORDER — HEPARIN (PORCINE) IN NACL 2-0.9 UNIT/ML-% IJ SOLN
INTRAMUSCULAR | Status: AC | PRN
Start: 2017-02-07 — End: 2017-02-07
  Administered 2017-02-07: 1000 mL

## 2017-02-07 MED ORDER — DIAZEPAM 5 MG PO TABS: 5.0000 mg | ORAL_TABLET | Freq: Four times a day (QID) | ORAL | Status: DC | PRN

## 2017-02-07 MED ORDER — FAMOTIDINE IN NACL 20-0.9 MG/50ML-% IV SOLN
20.0000 mg | Freq: Once | INTRAVENOUS | Status: AC
  Administered 2017-02-07: 20 mg via INTRAVENOUS
  Filled 2017-02-07: qty 50

## 2017-02-07 MED ORDER — ACETAMINOPHEN 325 MG PO TABS: 650.0000 mg | ORAL_TABLET | ORAL | Status: DC | PRN

## 2017-02-07 MED ORDER — PANTOPRAZOLE SODIUM 40 MG PO TBEC: 40.0000 mg | DELAYED_RELEASE_TABLET | Freq: Every day | ORAL | 0 refills | Status: DC

## 2017-02-07 MED ORDER — SODIUM CHLORIDE 0.9% FLUSH: 3.0000 mL | Freq: Two times a day (BID) | INTRAVENOUS | Status: DC

## 2017-02-07 MED ORDER — SODIUM CHLORIDE 0.9 % IV SOLN
INTRAVENOUS | Status: DC
  Administered 2017-02-07 (×2): via INTRAVENOUS

## 2017-02-07 MED ORDER — HEPARIN SODIUM (PORCINE) 1000 UNIT/ML IJ SOLN
INTRAMUSCULAR | Status: DC | PRN
  Administered 2017-02-07: 4000 [IU] via INTRAVENOUS

## 2017-02-07 SURGICAL SUPPLY — 13 items
CATH INFINITI 5FR ANG PIGTAIL (CATHETERS) ×2 IMPLANT
CATH INFINITI JR4 5F (CATHETERS) ×2 IMPLANT
CATH OPTITORQUE TIG 4.0 5F (CATHETERS) ×2 IMPLANT
DEVICE RAD COMP TR BAND LRG (VASCULAR PRODUCTS) ×2 IMPLANT
GLIDESHEATH SLEND SS 6F .021 (SHEATH) ×2 IMPLANT
GUIDEWIRE INQWIRE 1.5J.035X260 (WIRE) ×1 IMPLANT
INQWIRE 1.5J .035X260CM (WIRE) ×2
KIT HEART LEFT (KITS) ×2 IMPLANT
PACK CARDIAC CATHETERIZATION (CUSTOM PROCEDURE TRAY) ×2 IMPLANT
SYR MEDRAD MARK V 150ML (SYRINGE) ×2 IMPLANT
TRANSDUCER W/STOPCOCK (MISCELLANEOUS) ×2 IMPLANT
TUBING CIL FLEX 10 FLL-RA (TUBING) ×2 IMPLANT
WIRE HI TORQ VERSACORE-J 145CM (WIRE) ×2 IMPLANT

## 2017-02-07 NOTE — Progress Notes (Signed)
   02/07/17 1030  Clinical Encounter Type  Visited With Patient and family together  Visit Type Follow-up  Referral From Chaplain  Consult/Referral To Chaplain  Spiritual Encounters  Spiritual Needs Literature;Brochure  Advance Directives (For Healthcare)  Does Patient Have a Medical Advance Directive? No  Does patient want to make changes to medical advance directive? Yes (Inpatient - patient requests chaplain consult to change a medical advance directive)  Pt mentioned that she will get her own AD notarized outside of the hospital because she is not able to use her writing hand today.  Left forms with patient.  Advised patient to contact Spiritual Care if needed further assistance.   Chaplain Anaijah Augsburger A. Kayin Kettering,  MA-[facility], [phone]

## 2017-02-07 NOTE — Progress Notes (Signed)
Progress Note  Patient Name: Terri GilbertDeborah V Daniel Date of Encounter: 02/07/2017  Primary Cardiologist: Thayer OhmNew, H Menelik Mcfarren  Subjective   No recurrence of chest discomfort.  Inpatient Medications    Scheduled Meds: . aspirin  81 mg Oral Daily  . insulin aspart  0-5 Units Subcutaneous QHS  . insulin aspart  0-9 Units Subcutaneous TID WC  . metoprolol tartrate  25 mg Oral BID  . rosuvastatin  40 mg Oral q1800  . sodium chloride flush  3 mL Intravenous Q12H   Continuous Infusions: . sodium chloride 150 mL/hr at 02/07/17 0931  . sodium chloride    . heparin Stopped (02/07/17 0815)   PRN Meds: sodium chloride, acetaminophen, ALPRAZolam, diazepam, gi cocktail, morphine injection, ondansetron (ZOFRAN) IV, sodium chloride flush   Vital Signs    Vitals:   02/07/17 0900 02/07/17 0904 02/07/17 0909 02/07/17 1024  BP: 112/61 113/70 109/69 106/67  Pulse: (!) 114 68 83 66  Resp: 12 17 15    Temp:      TempSrc:      SpO2: 97% 99% 99%   Weight:      Height:        Intake/Output Summary (Last 24 hours) at 02/07/17 1225 Last data filed at 02/07/17 [id number]  Gross per 24 hour  Intake           162.28 ml  Output              500 ml  Net          -337.72 ml   Filed Weights   02/06/17 1142 02/06/17 1500  Weight: 174 lb 1.6 oz (79 kg) 177 lb 7.5 oz (80.5 kg)    Telemetry    Normal sinus rhythm - Personally Reviewed  ECG    Relatively low voltage, with normal sinus rhythm and nonspecific ST abnormality. - Personally Reviewed  Physical Exam  Obese GEN: No acute distress.   Neck: No JVD Cardiac: RRR, no murmurs, rubs, or gallops.  Respiratory: Clear to auscultation bilaterally. GI: Soft, nontender, non-distended  MS: No edema; No deformity. Right radial cath site is unremarkable. Neuro:  Nonfocal  Psych: Normal affect   Labs    Chemistry Recent Labs Lab 02/06/17 1151 02/06/17 1410 02/07/17 0115  NA 136  --  134*  K 3.8  --  3.5  CL 105  --  101  CO2 20*  --  22  GLUCOSE  167*  --  243*  BUN 15  --  16  CREATININE 0.96  --  0.97  CALCIUM 9.2  --  9.1  PROT  --  6.5  --   ALBUMIN  --  3.8  --   AST  --  24  --   ALT  --  46  --   ALKPHOS  --  89  --   BILITOT  --  0.7  --   GFRNONAA >60  --  >60  GFRAA >60  --  >60  ANIONGAP 11  --  11     Hematology Recent Labs Lab 02/06/17 1151 02/07/17 0115  WBC 13.0* 12.6*  RBC 4.42 4.13  HGB 13.0 12.1  HCT 39.6 37.3  MCV 89.6 90.3  MCH 29.4 29.3  MCHC 32.8 32.4  RDW 13.7 13.7  PLT 292 284    Cardiac Enzymes Recent Labs Lab 02/06/17 1406 02/06/17 2003 02/07/17 0115  TROPONINI <0.03 <0.03 <0.03    Recent Labs Lab 02/06/17 1217  TROPIPOC 0.00  BNPNo results for input(s): BNP, PROBNP in the last 168 hours.   DDimer No results for input(s): DDIMER in the last 168 hours.   Radiology    Dg Chest Portable 1 View  Result Date: 02/06/2017 CLINICAL DATA:  Chest pain. EXAM: PORTABLE CHEST 1 VIEW COMPARISON:  Radiographs of October 06, 2015. FINDINGS: The heart size and mediastinal contours are within normal limits. Both lungs are clear. No pneumothorax or pleural effusion is noted. The visualized skeletal structures are unremarkable. IMPRESSION: No acute cardiopulmonary abnormality seen. Electronically Signed   By: Lupita Raider, M.D.   On: 02/06/2017 12:49    Cardiac Studies   Coronary angiography completed today is normal.  Echocardiography 02/06/17: Study Conclusions  - Left ventricle: The cavity size was normal. Systolic function was   normal. The estimated ejection fraction was in the range of 60%   to 65%. Wall motion was normal; there were no regional wall   motion abnormalities. Left ventricular diastolic function   parameters were normal. - Atrial septum: No defect or patent foramen ovale was identified. - Impressions: Prominent epicardial fat pad.  Impressions:  - Prominent epicardial fat pad.   Patient Profile     61 y.o. female with multiple risk factors including  family history, tobacco use, hypertension, diabetes, and anxiety disorder who presented with prolonged chest pain of ischemic quality. Underwent coronary angiography today which is normal.  Assessment & Plan    1. Noncardiac chest pain, suspect GI. Start proton pump inhibitor therapy. Ready for discharge today. 2. Encouraged continuous risk factor modification to avoid significant ischemic heart disease in the future. Of utmost importance is smoking cessation.  Signed, Lesleigh Noe, MD  02/07/2017, 12:25 PM

## 2017-02-07 NOTE — Progress Notes (Signed)
ANTICOAGULATION CONSULT NOTE  Pharmacy Consult for heparin Indication: chest pain/ACS  Allergies  Allergen Reactions  . Other Shortness Of Breath  . Doxycycline Nausea And Vomiting  . Erythromycin Base Rash  . Sulfa Antibiotics Rash  . Contrast Media [Iodinated Diagnostic Agents] Hives    CT in 2004  . Prednisone     Heart pounding, rash    Patient Measurements: Height: 5\' 2"  (157.5 cm) Weight: 177 lb 7.5 oz (80.5 kg) IBW/kg (Calculated) : 50.1 Heparin Dosing Weight: 67.5kg  Vital Signs: Temp: 97.9 F (36.6 C) (06/06 0000) Temp Source: Oral (06/06 0000) BP: 93/56 (06/06 0100) Pulse Rate: 64 (06/06 0100)  Labs:  Recent Labs  02/06/17 1151 02/06/17 1406 02/06/17 2003 02/07/17 0115  HGB 13.0  --   --  12.1  HCT 39.6  --   --  37.3  PLT 292  --   --  284  LABPROT  --   --   --  13.3  INR  --   --   --  1.01  HEPARINUNFRC  --   --  0.38 0.32  CREATININE 0.96  --   --  0.97  TROPONINI  --  <0.03 <0.03 <0.03   Estimated Creatinine Clearance: 60.7 mL/min (by C-G formula based on SCr of 0.97 mg/dL).  Medical History: Past Medical History:  Diagnosis Date  . Anxiety   . Arthritis   . Asthma   . Bulging lumbar disc   . Chronic interstitial cystitis   . Depression   . Diabetes mellitus without complication (HCC)   . Disorder of left rotator cuff   . Fibromyalgia   . GERD (gastroesophageal reflux disease)   . HLD (hyperlipidemia)   . Hypertension   . Tobacco abuse   . Transaminitis    Medications:  Infusions:  . sodium chloride 1 mL/kg/hr (02/07/17 0754)  . famotidine (PEPCID) IV 20 mg (02/07/17 0752)  . heparin 900 Units/hr (02/06/17 1416)   Assessment: 60 yof presented to the ED with CP. Started on heparin infusion yesterday evening. Troponin remains negative and coronary angiography has been recommended to exclude coronary disease. CBC stable WNL. No bleeding or infusion issues reported. Heparin level at low end of therapeutic goal, will increase rate  slightly to prevent subtherapeutic levels.   Heparin Level Therapeutic: 0.32  Goal of Therapy:  Heparin level 0.3-0.7 units/ml Monitor platelets by anticoagulation protocol: Yes   Plan:  Increase heparin gtt to 950 units/hr Daily heparin level and CBC Monitor s/sx of bleeding  Ruben Imony Suzannah Bettes, PharmD Clinical Pharmacist 02/07/2017 8:12 AM

## 2017-02-07 NOTE — Progress Notes (Signed)
Pt transported to cath lab per RN, monitor, IV NTG, IV Heparin, and MIV fluids w/IVP Pepcid.

## 2017-02-07 NOTE — Interval H&P Note (Signed)
Cath Lab Visit (complete for each Cath Lab visit)  Clinical Evaluation Leading to the Procedure:   ACS: No.  Non-ACS:    Anginal Classification: CCS III  Anti-ischemic medical therapy: Minimal Therapy (1 class of medications)  Non-Invasive Test Results: No non-invasive testing performed  Prior CABG: No previous CABG      History and Physical Interval Note:  02/07/2017 8:31 AM  San Jettyeborah V Carpino  has presented today for surgery, with the diagnosis of unstable angina  The various methods of treatment have been discussed with the patient and family. After consideration of risks, benefits and other options for treatment, the patient has consented to  Procedure(s): Left Heart Cath and Coronary Angiography (N/A) as a surgical intervention .  The patient's history has been reviewed, patient examined, no change in status, stable for surgery.  I have reviewed the patient's chart and labs.  Questions were answered to the patient's satisfaction.     Nicki Guadalajarahomas Cruz Bong

## 2017-02-07 NOTE — H&P (View-Only) (Signed)
The patient has been seen in conjunction with Lucile Crater, PA-C. All aspects of care have been considered and discussed. The patient has been personally interviewed, examined, and all clinical data has been reviewed.   Compelling history of one severe episode of ischemia quality chest discomfort. To this point no objective data to pinpoint myocardial ischemia. Multiple risk factors including family history, tobacco, hypertension, diabetes and high stress levels.  Clinical presentation is that of unstable angina pectoris and possible non-ST elevation MI.  Agree with IV nitroglycerin and heparin. Cycle cardiac markers. Repeat ECG if recurrent pain and also in a.m.  We have recommended coronary angiography as as the most at right means of excluding critical coronary disease given her family history. The procedure and risks of stroke, death, myocardial infarction, bleeding, contrast allergy, kidney injury, among others was discussed in detail with patient and accepted. Plan to proceed with catheterization in the a.m.    Cardiology Consultation:   Patient ID: Terri Daniel; [id number]; [birth year]   Admit date: 02/06/2017 Date of Consult: 02/06/2017  Primary Care Provider: Clayborn Heron, MD Primary Cardiologist: New to Dr. Katrinka Blazing   Patient Profile:   Terri Daniel is a 61 y.o. female with a hx of ongoing tobacco abuse, DM, HTN, HLD, GERD, fibromyalgia, anxiety, arthritis, asthma, depression, chronic interstitial cystitis whom we are asked to see for chest pain by Dr. Delsa Bern.  History of Present Illness:   She was remotely seen by cardiology in [place] in 2008 and 2011 for chest pain with negative stress testing. She was diagnosed with costochondritis. She also has history of fibromyalgia and chronic back pain. Diabetes was diagnosed last year. She's smoked on-off for a total of 27 years.  About 2 weeks ago she developed R arm numbness associated with contractures in her 3, 4, 5th  fingers. She saw orthopedics and was put on prednisone without significant relief. She did feel this made her heart pound harder and said the pharmacist noticed a rash. She f/u with ortho yesterday at which time her grip strength was further reduced and finger abduction was not strong, prompting upcoming cervical imaging scheduled for this weekend. Last week one day randomly she was watching TV and had 30 minutes of right sided chest discomfort, no associated sx, resolved without intervention - happened again over this past weekend. This morning when she woke up she just didn't feel good. She was feeling dizzy and nauseated. She went on to work where suddenly while sitting at her desk she developed chest pressure described like a balloon inflating in her chest/elephant sitting on her chest, associated with SOB and diaphoresis. She called EMS and went and laid down. When EMS arrived they had her sit up and put 2 pillows behind her back and discomfort resolved. However, it subsequently returned and in the ER required 3 successive doses of NTG - felt worse after the first dose, then gradually better after the latter 2. She is on a NTG gtt and currently chest pain free. The discomfort was not worse with inspiration or palpation. She has never had anything like this before ever. She did feel jaw pain during one of the recurrences earlier. She also reports feeling leg cramps and headache last night, intermittent diarrhea then constipation, and teeth pain. Labs notable for CBG 167, troponin neg x1, WBC 13k, UA >500 glucose, CXR NAD. EKG NSR with nonspecific changes. She received 324mg  of aspirin by EMS. + Fam hx of CAD - "widow maker" MI in  her brother before age 55 requiring stent (then repeat stenting for what sounds like ISR), CABG in father age 55, and stenting in mother in her 31s.    Past Medical History:  Diagnosis Date  . Anxiety   . Arthritis   . Asthma   . Bulging lumbar disc   . Chronic interstitial  cystitis   . Depression   . Diabetes mellitus without complication (HCC)   . Disorder of left rotator cuff   . Fibromyalgia   . GERD (gastroesophageal reflux disease)   . HLD (hyperlipidemia)   . Hypertension   . Tobacco abuse   . Transaminitis     Past Surgical History:  Procedure Laterality Date  . CYSTOSCOPY    . JOINT REPLACEMENT Left 2009   Knee    Outpatient Meds - not yet reconciled - lisinopril HCTZ, clonidine, Prozac, Invokana, Januvia, Crestor (not yet started), Zyrtec, MVI, Biotin, inhaler (Symbicort), hydroxyzine  Inpatient Medications: Scheduled Meds: . insulin aspart  0-5 Units Subcutaneous QHS  . insulin aspart  0-9 Units Subcutaneous TID WC  . nitroGLYCERIN       Continuous Infusions: . heparin 900 Units/hr (02/06/17 1416)   PRN Meds:   Allergies:    Allergies  Allergen Reactions  . Erythromycin Base Rash    Social History:   Social History   Social History  . Marital status: Divorced    Spouse name: N/A  . Number of children: N/A  . Years of education: N/A   Occupational History  . Not on file.   Social History Main Topics  . Smoking status: Current Every Day Smoker    Packs/day: 0.50    Types: Cigarettes  . Smokeless tobacco: Not on file     Comment: 27 years total as of 2018  . Alcohol use No  . Drug use: No  . Sexual activity: Not on file   Other Topics Concern  . Not on file   Social History Narrative  . No narrative on file    Family History:   The patient's family history includes Atrial fibrillation in her mother; CAD in her brother, father, and mother; Diabetes Mellitus II in her brother.  ROS:  Please see the history of present illness. No vomiting, weight change, LEE. All other ROS reviewed and negative.     Physical Exam/Data:   Vitals:   02/06/17 1315 02/06/17 1330 02/06/17 1345 02/06/17 1400  BP: 119/69 122/68 (!) 146/79 117/71  Pulse: 83 84 83 87  Resp: 15 (!) 21 19 (!) 24  Temp:      TempSrc:      SpO2:  97% 97% 98% 98%  Weight:      Height:       No intake or output data in the 24 hours ending 02/06/17 1437 Filed Weights   02/06/17 1142  Weight: 174 lb 1.6 oz (79 kg)   Body mass index is 31.84 kg/m.  General: Well developed, well nourished obese WF in no acute distress. Head: Normocephalic, atraumatic, sclera non-icteric, no xanthomas, nares are without discharges Neck: Negative for carotid bruits. JVD not elevated. Lungs: Clear bilaterally to auscultation without wheezes, rales, or rhonchi. Breathing is unlabored. Heart: RRR with S1 S2. No murmurs, rubs, or gallops appreciated. Abdomen: Soft, non-tender, non-distended with normoactive bowel sounds. No hepatomegaly. No rebound/guarding. No obvious abdominal masses. Msk:  Strength and tone appear normal for age. Extremities: No clubbing or cyanosis. No edema.  Distal pedal pulses are 2+ and equal bilaterally. Neuro: Alert  and oriented X 3. No facial asymmetry. No focal deficit. Moves all extremities spontaneously. Psych:  Responds to questions appropriately with a normal affect.  EKG:  The EKG was personally reviewed and demonstrates NSR 80bpm, borderline left axis deviation, nonspecific ST-T changes with TW flattening II, III, avF, V1-V3, subtle TWI V4-V6, QTc 573ms? (TW difficult to discern) -> QT by EMS EKG was 390 and 460ms   Relevant CV Studies: None for review  Laboratory Data:  Chemistry Recent Labs Lab 02/06/17 1151  NA 136  K 3.8  CL 105  CO2 20*  GLUCOSE 167*  BUN 15  CREATININE 0.96  CALCIUM 9.2  GFRNONAA >60  GFRAA >60  ANIONGAP 11    No results for input(s): PROT, ALBUMIN, AST, ALT, ALKPHOS, BILITOT in the last 168 hours. Hematology Recent Labs Lab 02/06/17 1151  WBC 13.0*  RBC 4.42  HGB 13.0  HCT 39.6  MCV 89.6  MCH 29.4  MCHC 32.8  RDW 13.7  PLT 292    Recent Labs Lab 02/06/17 1217  TROPIPOC 0.00     Radiology/Studies:  Dg Chest Portable 1 View  Result Date: 02/06/2017 CLINICAL DATA:   Chest pain. EXAM: PORTABLE CHEST 1 VIEW COMPARISON:  Radiographs of October 06, 2015. FINDINGS: The heart size and mediastinal contours are within normal limits. Both lungs are clear. No pneumothorax or pleural effusion is noted. The visualized skeletal structures are unremarkable. IMPRESSION: No acute cardiopulmonary abnormality seen. Electronically Signed   By: Lupita RaiderJames  Green Jr, M.D.   On: 02/06/2017 12:49    Assessment and Plan:   89F with ongoing tobacco abuse, DM, HTN, HLD, GERD, fibromyalgia, anxiety, arthritis, asthma, depression, chronic interstitial cystitis whom we are asked to see for chest pain by Dr. Delsa BernJacobowicz. Has been recently undergoing workup by orthopedics for R arm numbness (constant) and contractures of fingers on right hand. Had 2 episodes of CP last week, then persistent severe discomfort today prompting admission.  1. Chest discomfort concerning for unstable angina - multiple risk factors including tobacco abuse, DM, HTN, HLD (pt reports she's not yet started Crestor) and family history of CAD. Clinical history has both mixed typical and atypical features. Clinical picture somewhat clouded by multitude of other non-cardiac symptoms including leg cramps, headache, tooth pain. EKG is abnormal with nonspecific changes and no prior available to compare to. Initial troponin is negative. Will review further with Dr. Katrinka BlazingSmith with regards to ischemic workup.  2. HTN - pending home med rec. Per H/P, IM plans to initiate beta blocker.  3. Ongoing tobacco abuse - counseled on dangers of continued use. Cessation advised.  4. Hyperlipidemia - pending lipid panel and LFTs. Patient reports she had not started Crestor yet. Will re-initiate in the hospital.  5. Diabetes mellitus - per IM.  6. Ongoing tobacco abuse - counseled on dangers of continued use. Cessation advised.  7. Right hand numbness/contracture - per patient, orthopedics suspected cervical spine issue and was pending outpatient  imaging. Will defer further management to primary team. Would consider avoiding right radial access so we don't complicate the clinical picture with this.  Signed, Laurann Montanaayna N Dunn, PA-C  02/06/2017 2:37 PM

## 2017-02-07 NOTE — Progress Notes (Signed)
Inpatient Diabetes Program Recommendations  AACE/ADA: New Consensus Statement on Inpatient Glycemic Control (2015)  Target Ranges:  Prepandial:   less than 140 mg/dL      Peak postprandial:   less than 180 mg/dL (1-2 hours)      Critically ill patients:  140 - 180 mg/dL   Review of Glycemic Control  Diabetes history: DM 2 Outpatient Diabetes medications: Invokana 100 mg Daily, Januvia 50 mg Daily Current orders for Inpatient glycemic control: Novolog Sensitive + HS scale  A1c 7.7% on 02/06/17  Inpatient Diabetes Program Recommendations:    Premedicated for contract dye allergy.  Glucose this am 243 mg/dl before IV Solumedrol 782125 mg given. Consider increasing Correction to Novolog Moderate Correction. Patient may need low dose basal insulin for today to cover hyperglycemia. Will watch trends.  Thanks,  Christena DeemShannon Lawerence Dery RN, MSN, [facility] Inpatient Diabetes Coordinator Team Pager [phone] (8a-5p)

## 2017-02-08 ENCOUNTER — Encounter (HOSPITAL_COMMUNITY): Payer: Self-pay | Admitting: Cardiovascular Disease

## 2017-02-15 ENCOUNTER — Encounter: Payer: Self-pay | Admitting: Neurology

## 2017-02-15 ENCOUNTER — Ambulatory Visit (INDEPENDENT_AMBULATORY_CARE_PROVIDER_SITE_OTHER): Payer: Medicare HMO | Admitting: Neurology

## 2017-02-15 VITALS — BP 130/80 | HR 92 | Ht 62.0 in | Wt 176.3 lb

## 2017-02-15 DIAGNOSIS — G5621 Lesion of ulnar nerve, right upper limb: Secondary | ICD-10-CM | POA: Insufficient documentation

## 2017-02-15 NOTE — Patient Instructions (Addendum)
1.  NCS/EMG of the right hand 2.  Start using a soft elbow pad to prevent compression of the nerve at the elbow 3.  At bedtime, you can place the elbow pad to serve as a block to prevent over flexion of the elbow 4.  Excellent work in quitting smoking - keep it up!  Return to clinic 3-4 months

## 2017-02-15 NOTE — Progress Notes (Signed)
[facility] Clinic Note - Initial Visit   Date: 02/15/17  Terri Daniel MRN: [id number] DOB: 1956-02-11   Dear Dr. Thurston Hole:  Thank you for your kind referral of Terri Daniel for consultation of right hand weakness. Although her history is well known to you, please allow [place] to reiterate it for the purpose of our medical record. The patient was accompanied to the clinic by self.    History of Present Illness: Terri Daniel is a 60 y.o. right-handed Caucasian female with hypertension, diabetes mellitus, hyperlipidemia, asthma, fibromyalgia, IBS, depression/anxiety, GERD, former smoker (quit last week) and interstitial cystitis presenting for evaluation of right hand weakness.    She woke up on May 18th with her right hand feeling as if it was asleep.  Throughout the day, her symptoms did not improve and then noticed hand weakness with brushing teeth, cutting food, and difficulty typing.   She has constant numbness over the 4th and 5th digit on the right and weakness of the hand.  She has some improvement such that her 5th digit on the right is able to extend it some, where as previously it was claw hand.  She denies any neck pain.  Because of left shoulder pain, she was sleeping on the right side and reports waking up with her arm extended under the pillow, with her head against the medial upper arm.  She does not remember if she slept on the arm with the elbow flexed.   She saw Dr. Thurston Hole for these symptoms whose note was reviewed.  MRI cervical spine which did not show any significant findings to explain her hand weakness.  She is referred here for further evaluation.  She also complains of generalized shakiness, confusion, word-finding difficulty, and bowel urgency.      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    Past Medical History:  Diagnosis Date  . Anxiety   . Arthritis   .  Asthma   . Bulging lumbar disc   . Chronic interstitial cystitis   . Depression   . Diabetes mellitus without complication (HCC)   . Disorder of left rotator cuff   . Fibromyalgia   . GERD (gastroesophageal reflux disease)   . HLD (hyperlipidemia)   . Hypertension   . Tobacco abuse   . Transaminitis     Past Surgical History:  Procedure Laterality Date  . CYSTOSCOPY    . JOINT REPLACEMENT Left 2009   Knee  . LEFT HEART CATH AND CORONARY ANGIOGRAPHY N/A 02/07/2017   Procedure: Left Heart Cath and Coronary Angiography;  Surgeon: Lennette Bihari, MD;  Location: [facility] INVASIVE CV LAB;  Service: Cardiovascular;  Laterality: N/A;     Medications:  Outpatient Encounter Prescriptions as of 02/15/2017  Medication Sig  . bimatoprost (LUMIGAN) 0.03 % ophthalmic solution Place 1 drop into both eyes at bedtime.  . cetirizine (ZYRTEC) 10 MG tablet Take 10 mg by mouth daily.  . cloNIDine (CATAPRES) 0.1 MG tablet Take 0.1 mg by mouth daily.  . cyclobenzaprine (FLEXERIL) 5 MG tablet Take 5 mg by mouth at bedtime.  . diclofenac sodium (VOLTAREN) 1 % GEL Place 1 application onto the skin 2 (two) times daily as needed.  Marland Kitchen FLUoxetine (PROZAC) 20 MG capsule Take 40 mg by mouth.   Marland Kitchen HYDROcodone-acetaminophen (NORCO/VICODIN) 5-325 MG tablet Take 1 tablet by mouth every 6 (six) hours as needed for pain.  . INVOKANA 100 MG TABS  tablet Take 100 mg by mouth daily.  Marland Kitchen. lisinopril-hydrochlorothiazide (PRINZIDE,ZESTORETIC) 20-12.5 MG tablet Take 2 tablets by mouth daily.  . meloxicam (MOBIC) 15 MG tablet Take 15 mg by mouth daily.  . Multiple Vitamin (MULTIVITAMIN WITH MINERALS) TABS tablet Take 1 tablet by mouth daily.  . Multiple Vitamins-Minerals (HAIR/SKIN/NAILS/BIOTIN PO) Take 1 tablet by mouth daily.  Marland Kitchen. omeprazole (PRILOSEC) 40 MG capsule   . rosuvastatin (CRESTOR) 40 MG tablet Take 40 mg by mouth daily.  . sitaGLIPtin (JANUVIA) 50 MG tablet Take 50 mg by mouth daily.  . [DISCONTINUED]  budesonide-formoterol (SYMBICORT) 160-4.5 MCG/ACT inhaler Inhale 2 puffs into the lungs daily.  . [DISCONTINUED] pantoprazole (PROTONIX) 40 MG tablet Take 1 tablet (40 mg total) by mouth daily at 6 (six) AM.   No facility-administered encounter medications on file as of 02/15/2017.      Allergies:  Allergies  Allergen Reactions  . Other Shortness Of Breath  . Doxycycline Nausea And Vomiting  . Erythromycin Base Rash  . Sulfa Antibiotics Rash  . Contrast Media [Iodinated Diagnostic Agents] Hives    CT in 2004  . Prednisone     Heart pounding, rash     Family History: Family History  Problem Relation Age of Onset  . CAD Mother        stent in her 3260s  . Atrial fibrillation Mother   . CAD Father        CABG age 61  . CAD Brother        "widow maker" MI before age 61 requiring stent  . Diabetes Mellitus II Brother     Social History: Social History  Substance Use Topics  . Smoking status: Current Every Day Smoker    Packs/day: 0.50    Types: Cigarettes  . Smokeless tobacco: Never Used     Comment: 27 years total as of 2018  . Alcohol use No   Social History   Social History Narrative   Lives alone in a one story home.  Has 2 children.  On disability due to interstitial cystitis.  Education: college.     Review of Systems:  CONSTITUTIONAL: No fevers, chills, night sweats, or weight loss.   EYES: No visual changes or eye pain ENT: No hearing changes.  No history of nose bleeds.   RESPIRATORY: No cough, wheezing and shortness of breath.   CARDIOVASCULAR: + chest pain, and palpitations.   GI: Negative for abdominal discomfort, blood in stools or black stools.  No recent change in bowel habits.   GU:  No history of incontinence.   MUSCLOSKELETAL: No history of joint pain or swelling.  No myalgias.   SKIN: Negative for lesions, rash, and itching.   HEMATOLOGY/ONCOLOGY: Negative for prolonged bleeding, bruising easily, and swollen nodes.  No history of cancer.     ENDOCRINE: Negative for cold or heat intolerance, polydipsia or goiter.   PSYCH:  +depression +anxiety symptoms.   NEURO: As Above.   Vital Signs:  BP 130/80   Pulse 92   Ht 5\' 2"  (1.575 m)   Wt 176 lb 5 oz (80 kg)   SpO2 97%   BMI 32.25 kg/m    General Medical Exam:   General:  Well appearing, comfortable.   Eyes/ENT: see cranial nerve examination.   Neck: No masses appreciated.  Full range of motion without tenderness.  No carotid bruits. Respiratory:  Clear to auscultation, good air entry bilaterally.   Cardiac:  Regular rate and rhythm, no murmur.   Extremities:  No deformities, edema, or skin discoloration.  Skin:  No rashes or lesions.  Neurological Exam: MENTAL STATUS including orientation to time, place, person, recent and remote memory, attention span and concentration, language, and fund of knowledge is normal.  Speech is not dysarthric.  CRANIAL NERVES: II:  No visual field defects.  Unremarkable fundi.   III-IV-VI: Pupils equal round and reactive to light.  Normal conjugate, extra-ocular eye movements in all directions of gaze.  No nystagmus.  No ptosis.   V:  Normal facial sensation.    VII:  Normal facial symmetry and movements.   VIII:  Normal hearing and vestibular function.   IX-X:  Normal palatal movement.   XI:  Normal shoulder shrug and head rotation.   XII:  Normal tongue strength and range of motion, no deviation or fasciculation.  MOTOR:  Motor strength is 5/5 throughout except 4/5 R FCU and interossei and 5-/5 FDP 4/5.  Mild ADM and FDI atrophy, fasciculations or abnormal movements.  No pronator drift.  Tone is normal.  Mild ulnar claw hand.   MSRs:  Reflexes are 1+/4 throughout.  Plantars are down going.  SENSORY:  Reduced pin prick and temperature over the 4th and 5th digits on the right, otherwise normal perception of light touch, pinprick, vibration, and temperature in the legs and LUE. Tinel's sign is negative at the elbow.  COORDINATION/GAIT:  Normal finger-to- nose-finger.  Intact rapid alternating movements bilaterally. Gait narrow based and stable. Stressed gait intact, she is unsteady with tandem gait but able to perform    IMPRESSION: Mrs. Cosens is a 61 year-old female referred for evaluation of acute onset of right hand numbness and weakness.  Symptoms started suddenly after awakening on 5/18 and progressed to involve numbness/tingling over the 4th and 5th digit on the right as well as weakness of grip and finger flexion. Since onset, she has noticed some improvement such as now she is able to oppose the thumb to the 5th digit and extend the last two fingers better.  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  There are no upper motor neuron findings to suggest a central process.  NCS/EMG of the right arm will be ordered to determine the severity and to better localize her symptoms.  In the meantime, strategies to minimize trauma to the ulnar nerve were discussed.  She does not wish to take anything for her painful paresthesias.   PLAN/RECOMMENDATIONS:  1.  NCS/EMG of the right hand will be scheduled at least 3-4 weeks from symptom onset 2.  Start using a soft elbow pad to prevent compression of the nerve at the elbow and hyperflexion of the elbow 3.  Hand PT declined at this time, she will continue home exercises and may reconsider based on the results of her EMG 4.  Smoking cessation instruction/counseling given:  commended patient for quitting and reviewed strategies for preventing relapses 5.  Encouraged tight glycemic control to optimize neural recovery    Return to clinic 3-4 months    The duration of this appointment visit was 45 minutes of face-to-face time with the patient.  Greater than 50% of this time was spent in counseling, explanation of diagnosis, planning of further management, and coordination of care.   Thank you for allowing me to participate in patient's care.  If I  can answer any additional questions, I would be pleased to do so.    Sincerely,    Travers Goodley K. Allena Katz, DO

## 2017-02-26 NOTE — Discharge Summary (Signed)
[facility] Discharge Summary   Patient: Terri Daniel ZOX:[id number]   PCP: Terri Heron, MD DOB: 11-11-1955   Date of admission: 02/06/2017   Date of discharge: 02/07/2017    Discharge Diagnoses:  Principal Problem:   Chest pain Active Problems:   HTN (hypertension)   Diabetes mellitus type 2 in obese (HCC)   Fibromyalgia   Leukocytosis   Chronic interstitial cystitis   Obesity (BMI 30.0-34.9)   HLD (hyperlipidemia)   Rotator cuff dysfunction, left   Asthma   Admitted From: home Disposition:  home  Recommendations for Outpatient Follow-up:  1. Please follow up with PCP as recommended   Follow-up Information    Terri Daniel, Terri Dance, MD. Schedule an appointment as soon as possible for a visit in 1 week(s).   Specialty:  Family Medicine Contact information: [address] [place] [address] [phone]          Diet recommendation: cardiac diet  Activity: The patient is advised to gradually reintroduce usual activities.  Discharge Condition: good  Code Status: full code  History of present illness: As per the H and P dictated on admission, "Terri Daniel is a 61 y.o. female with medical history significant for diabetes mellitus on oral agents, asthma, hypertension, ongoing tobacco abuse, obesity, left rotator cuff dysfunction, chronic interstitial cystitis successfully treated with Botox injection, fibromyalgia and dyslipidemia. Patient reports that while seated at work today around 11 AM she suddenly developed chest discomfort described as "an elephant sitting on my chest". This discomfort was unrelenting. She became diaphoretic and short of breath. Earlier in the morning before going to work she just "felt bad" and felt nauseous and dizzy. She also reports nonspecific right-sided similar chest pressure last week and over this past weekend. EMS was called to her work and she was given an aspirin. She has subsequently been given 3 nitroglycerin SL  in the ER with pain decreasing from 10/10-6/10 and eventually down to 2/10 but has continued to wax and wane and has never been completely resolved. She was started on nitroglycerin infusion by the ER physician. She was also started on oxygen. Her EKG shows very subtle nonspecific ST segment downsloping in inferior lateral leads most prominent in V4 and V5."  Hospital Course:  Summary of her active problems in the hospital is as following.  Chest pain -Patient presents with typical chest discomfort with associated diaphoresis, shortness of breath nausea and dizziness with subtle EKG changes -Patient has had improvement and but not resolution of chest pain with combination of nitroglycerin and aspirin with current chest pain level 2/10 on nitroglycerin infusion -Multiple risk factors as follows: Postmenopausal, obese, diabetes, hypertension, dyslipidemia, tobacco abuse, extensive family history with brother, father and mother with MI /CAD history Cardiology was consulted, underwent cardiac cath. Showed no sign obstruction and pt was discharge home on oral PPI as there was suspicion for GI cause of pain.  Active Problems:   HTN (hypertension) -On lisinopril with hydrochlorothiazide and clonidine at home    Diabetes mellitus type 2 in obese  -Januvia at home    Leukocytosis resolved     HLD (hyperlipidemia) -Based on care everywhere documentation patient was taking Crestor 40 mg daily at home    Fibromyalgia -Patient takes Prozac and is no longer on narcotics or other chronic pain medications    Chronic interstitial cystitis -Underwent successful Botox injection earlier in the year and no longer requires chronic pain medications -She is not on antimicrobial suppressive therapy  Leg cramps Resolved     Obesity (BMI 30.0-34.9) -Discussed weight reduction with patient -Weight reduction strategies to be addressed by new PCP Terri Daniel    Rotator cuff dysfunction,  left -Patient reports asymptomatic at this juncture and current chest discomfort not precipitated by movement in left arm    Right hand contractures with numbness -New issues evaluated by orthopedic team on 6/4 with recommendation for outpatient cervical spine imaging    Asthma -Resume preadmission MDIs once medications reconciled  All other chronic medical condition were stable during the hospitalization.  Patient was ambulatory without any assistance. On the day of the discharge the patient's vitals were stable, and no other acute medical condition were reported by patient. the patient was felt safe to be discharge at home with familt.  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  The left ventricular ejection fraction is 55-65% by visual estimate.  The left ventricular systolic function is normal.  LV end diastolic pressure is normal.   No significant coronary obstructive disease with only mild 20% luminal irregularity in the AV groove circumflex prior to the takeoff of a marginal vessel; normal LAD, small ramus intermediate, and dominant RCA.  Normal LV function with an ejection fraction at 55-60%.   Echocardiogram   Study Conclusions  - Left ventricle: The cavity size was normal. Systolic function was   normal. The estimated ejection fraction was in the range of 60%   to 65%. Wall motion was normal; there were no regional wall   motion abnormalities. Left ventricular diastolic function   parameters were normal. - Atrial septum: No defect or patent foramen ovale was identified. - Impressions: Prominent epicardial fat pad.  Impressions:  - Prominent epicardial fat pad.  Consultations:  Cardiology   DISCHARGE MEDICATION: Discharge Medication List as of 02/07/2017  2:06 PM    START taking these medications   Details  pantoprazole (PROTONIX) 40 MG tablet Take 1 tablet (40 mg total) by mouth daily at 6 (six) AM., Starting Thu 02/08/2017,  Normal      CONTINUE these medications which have NOT CHANGED   Details  bimatoprost (LUMIGAN) 0.03 % ophthalmic solution Place 1 drop into both eyes at bedtime., Historical Med    cetirizine (ZYRTEC) 10 MG tablet Take 10 mg by mouth daily., Starting Sat 04/22/2016, Until Sun 04/22/2017, Historical Med    cloNIDine (CATAPRES) 0.1 MG tablet Take 0.1 mg by mouth daily., Starting Wed 12/13/2016, Historical Med    cyclobenzaprine (FLEXERIL) 5 MG tablet Take 5 mg by mouth at bedtime., Starting Thu 01/11/2017, Historical Med    diclofenac sodium (VOLTAREN) 1 % GEL Place 1 application onto the skin 2 (two) times daily as needed., Historical Med    FLUoxetine (PROZAC) 20 MG capsule Take 20 mg by mouth., Starting Wed 11/01/2016, Historical Med    HYDROcodone-acetaminophen (NORCO/VICODIN) 5-325 MG tablet Take 1 tablet by mouth every 6 (six) hours as needed for pain., Starting Tue 08/15/2016, Historical Med    INVOKANA 100 MG TABS tablet Take 100 mg by mouth daily., Starting Mon 01/08/2017, Historical Med    lisinopril-hydrochlorothiazide (PRINZIDE,ZESTORETIC) 20-12.5 MG tablet Take 2 tablets by mouth daily., Starting Wed 12/13/2016, Historical Med    meloxicam (MOBIC) 15 MG tablet Take 15 mg by mouth daily., Starting Thu 01/11/2017, Historical Med    !! Multiple Vitamin (MULTIVITAMIN WITH MINERALS) TABS tablet Take 1 tablet by mouth daily., Historical Med    !! Multiple Vitamins-Minerals (HAIR/SKIN/NAILS/BIOTIN PO) Take 1 tablet by  mouth daily., Historical Med    rosuvastatin (CRESTOR) 40 MG tablet Take 40 mg by mouth daily., Historical Med    sitaGLIPtin (JANUVIA) 50 MG tablet Take 50 mg by mouth daily., Starting Fri 03/17/2016, Historical Med    budesonide-formoterol (SYMBICORT) 160-4.5 MCG/ACT inhaler Inhale 2 puffs into the lungs daily., Historical Med     !! - Potential duplicate medications found. Please discuss with provider.     Allergies  Allergen Reactions  . Other Shortness Of Breath    . Doxycycline Nausea And Vomiting  . Erythromycin Base Rash  . Sulfa Antibiotics Rash  . Contrast Media [Iodinated Diagnostic Agents] Hives    CT in 2004  . Prednisone     Heart pounding, rash    Discharge Instructions    Diet - low sodium heart healthy    Complete by:  As directed    Diet Carb Modified    Complete by:  As directed    Discharge instructions    Complete by:  As directed    It is important that you read following instructions as well as go over your medication list with RN to help you understand your care after this hospitalization.  Discharge Instructions: Please follow-up with PCP in one week  Please request your primary care physician to go over all Hospital Tests and Procedure/Radiological results at the follow up,  Please get all Hospital records sent to your PCP by signing hospital release before you go home.   Do not take more than prescribed Pain, Sleep and Anxiety Medications. You were cared for by a hospitalist during your hospital stay. If you have any questions about your discharge medications or the care you received while you were in the hospital after you are discharged, you can call the unit and ask to speak with the hospitalist on call if the hospitalist that took care of you is not available.  Once you are discharged, your primary care physician will handle any further medical issues. Please note that NO REFILLS for any discharge medications will be authorized once you are discharged, as it is imperative that you return to your primary care physician (or establish a relationship with a primary care physician if you do not have one) for your aftercare needs so that they can reassess your need for medications and monitor your lab values. You Must read complete instructions/literature along with all the possible adverse reactions/side effects for all the Medicines you take and that have been prescribed to you. Take any new Medicines after you have  completely understood and accept all the possible adverse reactions/side effects. Wear Seat belts while driving. If you have smoked or chewed Tobacco in the last 2 yrs please stop smoking and/or stop any Recreational drug use.   Increase activity slowly    Complete by:  As directed      Discharge Exam: Filed Weights   02/06/17 1142 02/06/17 1500  Weight: 79 kg (174 lb 1.6 oz) 80.5 kg (177 lb 7.5 oz)   Vitals:   02/07/17 0909 02/07/17 1024  BP: 109/69 106/67  Pulse: 83 66  Resp: 15   Temp:     General: Appear in no distress, no Rash; Oral Mucosa moist. Cardiovascular: S1 and S2 Present, no Murmur, no JVD Respiratory: Bilateral Air entry present and Clear to Auscultation, no Crackles, no wheezes Abdomen: Bowel Sound present, Soft and no tenderness Extremities: no Pedal edema, no calf tenderness Neurology: Grossly no focal neuro deficit.  The results of significant diagnostics  from this hospitalization (including imaging, microbiology, ancillary and laboratory) are listed below for reference.    Significant Diagnostic Studies: Dg Chest Portable 1 View  Result Date: 02/06/2017 CLINICAL DATA:  Chest pain. EXAM: PORTABLE CHEST 1 VIEW COMPARISON:  Radiographs of October 06, 2015. FINDINGS: The heart size and mediastinal contours are within normal limits. Both lungs are clear. No pneumothorax or pleural effusion is noted. The visualized skeletal structures are unremarkable. IMPRESSION: No acute cardiopulmonary abnormality seen. Electronically Signed   By: Lupita Raider, M.D.   On: 02/06/2017 12:49   Time spent: 35 minutes  Signed:  Lynden Oxford  [facility] 02/07/2017 , 1:03 PM

## 2017-03-01 ENCOUNTER — Telehealth: Payer: Self-pay | Admitting: Neurology

## 2017-03-01 ENCOUNTER — Other Ambulatory Visit: Payer: Self-pay | Admitting: *Deleted

## 2017-03-01 ENCOUNTER — Ambulatory Visit (INDEPENDENT_AMBULATORY_CARE_PROVIDER_SITE_OTHER): Payer: Medicare HMO | Admitting: Neurology

## 2017-03-01 DIAGNOSIS — G5621 Lesion of ulnar nerve, right upper limb: Secondary | ICD-10-CM

## 2017-03-01 NOTE — Procedures (Signed)
[facility]  [address], [address]  [place], [place] [address] Tel: [phone] Fax:  [phone] Test Date:  03/01/2017  Patient: Terri BougieDeborah Daniel DOB: 06-17-1956 Physician: Nita Sickleonika Keari Miu, DO  Sex: Female Height: 5\' 2"  Ref Phys: Nita Sickleonika Abednego Yeates, DO  ID#: [id number] Temp: 39.8C Technician:    Patient Complaints: This is a 61 year-old female referred for evalution of right hand numbness over the last two digits, hand weakness, and claw hand deformity.  NCV & EMG Findings: Electrodiagnostic testing of the right upper extremity and additional studies of the left shows:  1. Right ulnar, dorsal ulnar cutaneous, and right median sensory responses are within normal limits. Of note, although the right ulnar sensory response is within normal limits, it is 47% asymmetrically reduced amplitude as compared to the left. 2. Right ulnar motor response at the abductor digiti minimi and first dorsal interosseous muscle shows normal amplitude distally, however there is evidence of conduction block and marked decreased conduction velocity below the elbow (A Elbow-B Elbow, R21, R37 m/s).  Right median motor responses within normal limits.  3. Chronic motor axon loss changes are seen in the flexor carpi ulnaris and abductor digiti minimi muscles with evidence of rapid recruitment in the first dorsal interosseous and flexor digitorum profundus 4 & 5 muscles. There is evidence of active denervation in the first dorsal interosseous and abductor digiti minimi muscles.   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    ___________________________ Nita Sickleonika Mendy Chou, DO    Nerve Conduction Studies Anti Sensory Summary Table   Site NR Peak (ms) Norm Peak (ms) P-T Amp (V) Norm P-T Amp  Left DorsCutan Anti Sensory (Dorsum 5th MC)  39.8C  Site 2    1.5  15.7   Right DorsCutan Anti Sensory (Dorsum 5th MC)  39.8C  Wrist    1.6 <3.2  13.9 >5  Right Median Anti Sensory (2nd Digit)  39.8C  Wrist    3.0 <3.8 31.6 >10  Left Ulnar Anti Sensory (5th Digit)  39.8C  Wrist    2.3 <3.2 27.7 >5  Right Ulnar Anti Sensory (5th Digit)  39.8C  Wrist    2.2 <3.2 14.6 >5   Motor Summary Table   Site NR Onset (ms) Norm Onset (ms) O-P Amp (mV) Norm O-P Amp Site1 Site2 Delta-0 (ms) Dist (cm) Vel (m/s) Norm Vel (m/s)  Right Median Motor (Abd Poll Brev)  39.8C  Wrist    2.8 <4.0 7.7 >5 Elbow Wrist 3.7 23.0 62 >50  Elbow    6.5  7.6         Right Ulnar Motor (Abd Dig Minimi)  39.8C  Wrist    2.0 <3.1 7.8 >7 B Elbow Wrist 2.2 19.0 86 >50  B Elbow    4.2  7.6  A Elbow B Elbow 4.8 10.0 21 >50  A Elbow    9.0  3.9         Right Ulnar (FDI) Motor (1st DI)  39.8C  Wrist    3.4 <4.5 12.3 >7 B Elbow Wrist 2.1 19.0 90 >50  B Elbow    5.5  11.2  A Elbow B Elbow 2.7 10.0 37 >50  A Elbow    8.2  3.5          EMG   Side Muscle Ins Act Fibs Psw Fasc Number Recrt Dur Dur. Amp Amp. Poly Poly. Comment  Right 1stDorInt Nml 1+ Nml Nml  1- Rapid Nml Nml Nml Nml Nml Nml N/A  Right ABD Dig Min Nml 1+ Nml Nml 2- Rapid Some 1+ Nml Nml Nml Nml N/A  Right Ext Indicis Nml Nml Nml Nml Nml Nml Nml Nml Nml Nml Nml Nml N/A  Right FlexCarpiUln Nml Nml Nml Nml 2- Rapid Some 1+ Some 1+ Some 1+ N/A  Right FlexDigProf 4,5 Nml Nml Nml Nml 2- Rapid Nml Nml Nml Nml Nml Nml N/A  Right PronatorTeres Nml Nml Nml Nml Nml Nml Nml Nml Nml Nml Nml Nml N/A  Right Biceps Nml Nml Nml Nml Nml Nml Nml Nml Nml Nml Nml Nml N/A  Right Triceps Nml Nml Nml Nml Nml Nml Nml Nml Nml Nml Nml Nml N/A  Right Deltoid Nml Nml Nml Nml Nml Nml Nml Nml Nml Nml Nml Nml N/A      Waveforms:

## 2017-03-01 NOTE — Telephone Encounter (Signed)
Left message for US tech to call me back.

## 2017-03-01 NOTE — Telephone Encounter (Signed)
Results of EMG discussed with patient which shows subacute right ulnar neuropathy at or immediately below the elbow, purely demyelinating in type. Because her symptoms are relatively acute in onset, I would like to obtain nerve ultrasound to look for any structural compression of the nerve or edema.  Terri Junod K. Allena KatzPatel, DO

## 2017-03-02 ENCOUNTER — Other Ambulatory Visit: Payer: Self-pay | Admitting: *Deleted

## 2017-03-15 NOTE — Telephone Encounter (Signed)
[facility].

## 2017-03-19 ENCOUNTER — Other Ambulatory Visit: Payer: Self-pay | Admitting: *Deleted

## 2017-03-19 DIAGNOSIS — G5621 Lesion of ulnar nerve, right upper limb: Secondary | ICD-10-CM

## 2017-04-03 ENCOUNTER — Telehealth: Payer: Self-pay | Admitting: *Deleted

## 2017-04-03 NOTE — Telephone Encounter (Signed)
Called patient to inform her that I have scheduled the US for August 3 at 1:00.  She said that her arm is so much better (about 90% better) since she has been wearing wrist splints and exercising.  Dr. Allena KatzPatel notified and said it would be ok to hold off on the US.  Patient notified and US cancelled.

## 2017-04-03 NOTE — Telephone Encounter (Signed)
-----   Message from Glendale Chardonika K Patel, DO sent at 03/30/2017  1:47 PM EDT ----- Regarding: FW: nerve ultrasound This is the patient that needs nerve US. Thanks. ----- Message ----- From: [place]ale ChardPatel, Donika K, DO Sent: 03/13/2017   3:54 PM To: Glendale Chardonika K Patel, DO Subject: nerve ultrasound

## 2017-04-06 ENCOUNTER — Ambulatory Visit (HOSPITAL_COMMUNITY): Payer: Medicare HMO

## 2017-06-27 ENCOUNTER — Ambulatory Visit: Payer: Medicare HMO | Admitting: Neurology

## 2017-07-12 ENCOUNTER — Encounter: Payer: Self-pay | Admitting: Endocrinology

## 2017-07-12 ENCOUNTER — Ambulatory Visit: Payer: Medicare HMO | Admitting: Endocrinology

## 2017-07-12 VITALS — BP 138/90 | HR 74 | Wt 173.2 lb

## 2017-07-12 DIAGNOSIS — E669 Obesity, unspecified: Secondary | ICD-10-CM

## 2017-07-12 DIAGNOSIS — E1169 Type 2 diabetes mellitus with other specified complication: Secondary | ICD-10-CM | POA: Diagnosis not present

## 2017-07-12 LAB — POCT GLYCOSYLATED HEMOGLOBIN (HGB A1C): HEMOGLOBIN A1C: 8.4

## 2017-07-12 MED ORDER — BROMOCRIPTINE MESYLATE 2.5 MG PO TABS: ORAL_TABLET | ORAL | 11 refills | Status: DC

## 2017-07-12 MED ORDER — PIOGLITAZONE HCL 15 MG PO TABS: 15.0000 mg | ORAL_TABLET | Freq: Every day | ORAL | 11 refills | Status: DC

## 2017-07-12 NOTE — Progress Notes (Signed)
Subjective:    Patient ID: Terri Daniel, female    DOB: [birth year], 61 y.o.   MRN: [id number]  HPI pt is referred by Dr Barbaraann Barthelankins, for diabetes.  Pt states DM was dx'ed in 2013; she has mild if any neuropathy of the lower extremities; she is unaware of any associated chronic complications; she has never been on insulin; pt says her diet and exercise are; she has never had GDM, pancreatitis, pancreatic surgery, severe hypoglycemia or DKA.  She says cbg's are in 200's.  Dosage of metformin-XR is limited by nausea.  She says she cannot afford brand-name medications.   Past Medical History:  Diagnosis Date  . Anxiety   . Arthritis   . Asthma   . Bulging lumbar disc   . Chronic interstitial cystitis   . Depression   . Diabetes mellitus without complication (HCC)   . Disorder of left rotator cuff   . Fibromyalgia   . GERD (gastroesophageal reflux disease)   . HLD (hyperlipidemia)   . Hypertension   . Tobacco abuse   . Transaminitis     Past Surgical History:  Procedure Laterality Date  . CYSTOSCOPY    . JOINT REPLACEMENT Left 2009   Knee    Social History   Socioeconomic History  . Marital status: Divorced    Spouse name: Not on file  . Number of children: 2  . Years of education: 9016  . Highest education level: Not on file  Social Needs  . Financial resource strain: Not on file  . Food insecurity - worry: Not on file  . Food insecurity - inability: Not on file  . Transportation needs - medical: Not on file  . Transportation needs - non-medical: Not on file  Occupational History  . Occupation: disabled  Tobacco Use  . Smoking status: Current Every Day Smoker    Packs/day: 0.50    Types: Cigarettes  . Smokeless tobacco: Never Used  . Tobacco comment: 27 years total as of 2018  Substance and Sexual Activity  . Alcohol use: No  . Drug use: No  . Sexual activity: Not on file  Other Topics Concern  . Not on file  Social History Narrative   Lives alone in a one  story home.  Has 2 children.  On disability due to interstitial cystitis.  Education: college.     Current Outpatient Medications on File Prior to Visit  Medication Sig Dispense Refill  . bimatoprost (LUMIGAN) 0.03 % ophthalmic solution Place 1 drop into both eyes at bedtime.    . citalopram (CELEXA) 40 MG tablet Take 40 mg daily by mouth.    . cyclobenzaprine (FLEXERIL) 5 MG tablet Take 5 mg by mouth at bedtime.  0  . diclofenac sodium (VOLTAREN) 1 % GEL Place 1 application onto the skin 2 (two) times daily as needed.    Marland Kitchen. glimepiride (AMARYL) 2 MG tablet Take 2 mg daily with breakfast by mouth.    Marland Kitchen. HYDROcodone-acetaminophen (NORCO/VICODIN) 5-325 MG tablet Take 1 tablet by mouth every 6 (six) hours as needed for pain.    . hydrOXYzine (ATARAX/VISTARIL) 25 MG tablet Take 25 mg once as needed by mouth.    Marland Kitchen. lisinopril-hydrochlorothiazide (PRINZIDE,ZESTORETIC) 20-12.5 MG tablet Take 2 tablets by mouth daily.    . meloxicam (MOBIC) 15 MG tablet Take 15 mg by mouth daily.  3  . metFORMIN (GLUCOPHAGE-XR) 500 MG 24 hr tablet Take 500 mg daily with breakfast by mouth.    . Multiple Vitamin (  MULTIVITAMIN WITH MINERALS) TABS tablet Take 1 tablet by mouth daily.    . Multiple Vitamins-Minerals (HAIR/SKIN/NAILS/BIOTIN PO) Take 1 tablet by mouth daily.    . rosuvastatin (CRESTOR) 40 MG tablet Take 40 mg by mouth daily.    . cetirizine (ZYRTEC) 10 MG tablet Take 10 mg by mouth daily.    . cloNIDine (CATAPRES) 0.1 MG tablet Take 0.1 mg by mouth daily.    Marland Kitchen. FLUoxetine (PROZAC) 20 MG capsule Take 40 mg by mouth.     Marland Kitchen. omeprazole (PRILOSEC) 40 MG capsule      No current facility-administered medications on file prior to visit.     Allergies  Allergen Reactions  . Other Shortness Of Breath  . Doxycycline Nausea And Vomiting  . Erythromycin Base Rash  . Sulfa Antibiotics Rash  . Contrast Media [Iodinated Diagnostic Agents] Hives    CT in 2004  . Prednisone     Heart pounding, rash     Family  History  Problem Relation Age of Onset  . CAD Mother        stent in her 4760s  . Atrial fibrillation Mother   . CAD Father        CABG age 61  . Diabetes Father   . CAD Brother        "widow maker" MI before age 61 requiring stent  . Diabetes Mellitus II Brother     BP 138/90 (BP Location: Right Arm, Patient Position: Sitting, Cuff Size: Normal)   Pulse 74   Wt 173 lb 3.2 oz (78.6 kg)   SpO2 97%   BMI 31.68 kg/m     Review of Systems denies chest pain, sob, vomiting, muscle cramps, memory loss, and cold intolerance.  She has chronic thirst, diaphoresis, rhinorrhea, easy bruising, weight gain, depression, dysuria, blurry vision, and fatigue.      Objective:   Physical Exam VS: see vs page GEN: no distress HEAD: head: no deformity eyes: no periorbital swelling, no proptosis external nose and ears are normal mouth: no lesion seen NECK: supple, thyroid is not enlarged CHEST WALL: no deformity LUNGS: clear to auscultation CV: reg rate and rhythm, no murmur ABD: abdomen is soft, nontender.  no hepatosplenomegaly.  not distended.  no hernia MUSCULOSKELETAL: muscle bulk and strength are grossly normal.  no obvious joint swelling.  gait is normal and steady EXTEMITIES: no deformity.  no ulcer on the feet.  feet are of normal color and temp.  no edema PULSES: dorsalis pedis intact bilat.  no carotid bruit NEURO:  cn 2-12 grossly intact.   readily moves all 4's.  sensation is intact to touch on the feet.  SKIN:  Normal texture and temperature.  No rash or suspicious lesion is visible.   NODES:  None palpable at the neck PSYCH: alert, well-oriented.  Does not appear anxious nor depressed.   A1c=8.4%  I have reviewed outside records, and summarized:  Pt was noted to have elevated a1c, and referred here.  She was admitted 5 mos ago for chest pain.  Was determined to be noncardiogenic.       Assessment & Plan:  Type 2 DM, new to me: she needs increased rx.    Patient  Instructions  good diet and exercise significantly improve the control of your diabetes.  please let me know if you wish to be referred to a dietician.  high blood sugar is very risky to your health.  you should see an eye doctor and dentist every year.  It is very important to get all recommended vaccinations.  Controlling your blood pressure and cholesterol drastically reduces the damage diabetes does to your body.  Those who smoke should quit.  Please discuss these with your doctor.  check your blood sugar once a day.  vary the time of day when you check, between before the 3 meals, and at bedtime.  also check if you have symptoms of your blood sugar being too high or too low.  please keep a record of the readings and bring it to your next appointment here (or you can bring the meter itself).  You can write it on any piece of paper.  please call us sooner if your blood sugar goes below 70, or if you have a lot of readings over 200.  I have sent prescriptions to your pharmacy, to add 2 more medications.   Please come back for a follow-up appointment in 2-3 weeks.  Please see linda at the same time, to learn how to draw up and inject insulin.   When next year gets here, we could resume the [place] by cutting them in half, to keep you out of the "donut hole."

## 2017-07-12 NOTE — Patient Instructions (Addendum)
good diet and exercise significantly improve the control of your diabetes.  please let me know if you wish to be referred to a dietician.  high blood sugar is very risky to your health.  you should see an eye doctor and dentist every year.  It is very important to get all recommended vaccinations.  Controlling your blood pressure and cholesterol drastically reduces the damage diabetes does to your body.  Those who smoke should quit.  Please discuss these with your doctor.  check your blood sugar once a day.  vary the time of day when you check, between before the 3 meals, and at bedtime.  also check if you have symptoms of your blood sugar being too high or too low.  please keep a record of the readings and bring it to your next appointment here (or you can bring the meter itself).  You can write it on any piece of paper.  please call us sooner if your blood sugar goes below 70, or if you have a lot of readings over 200.  I have sent prescriptions to your pharmacy, to add 2 more medications.   Please come back for a follow-up appointment in 2-3 weeks.  Please see linda at the same time, to learn how to draw up and inject insulin.   When next year gets here, we could resume the [place] and januvia by cutting them in half, to keep you out of the "donut hole."

## 2017-08-07 ENCOUNTER — Encounter: Payer: Medicare HMO | Attending: Endocrinology | Admitting: Nutrition

## 2017-08-07 DIAGNOSIS — E1169 Type 2 diabetes mellitus with other specified complication: Secondary | ICD-10-CM | POA: Insufficient documentation

## 2017-08-07 DIAGNOSIS — Z713 Dietary counseling and surveillance: Secondary | ICD-10-CM | POA: Diagnosis not present

## 2017-08-07 DIAGNOSIS — E669 Obesity, unspecified: Secondary | ICD-10-CM | POA: Insufficient documentation

## 2017-08-07 NOTE — Patient Instructions (Signed)
Add protein to breakfast meal Continue to walk your dog twice daily for 20 min Test blood sugar before breakfast and one other time later in the day: before supper, or bedtime or 2 hours after eating supper.

## 2017-08-07 NOTE — Progress Notes (Signed)
Pt. Reported that the new medication that dr.Ellison put her on 4 weeks ago, is working Animatorbeautiful.  FBS today was 110.  She is complaining that she is feeling weak and shaky at 4PM every day.  She will occasionally check her blood sugar, and she is in the 70s.  She drinks juice and or eats a piece of hard candy and feels better in about 15 min.  She is seeing him tomorrow and was encouraged to tell him this at her visit.   Diet is as follows: 8-8:30AM up,  2 cups of coffee  11:30 3 breakfast bars, (40 grams of carb  200 calories 2PM: string cheese 5PM: 3-4 ounces protein (chicken, pork or beef), salad, small amount of fat. 9PM: 2 tsp of peanut butter, or handful of chips or 3 small pieces of chocolate.  Water: 86 ounces/day  Exercise: walks dog 15-20 min. BID Medications;  Meftormin ER 500 pcB and pcS                       Glymeperide 2 mg. pcG and pcS                       Brococriptine 0.6mg  pcS                       Pioglitazone 15 mg. qAM Discussed the importance of carbs and encouraged her to add at least 15-30  grams of carb with her supper meal, in the form of a veg. Like a potato,or corn.  She agreed to do this.   SBGM:  She does not like her [employer] meter- It takes too much blood and she wastes strips.  She was given a Accu-Chek guide and she likes it.  Her blood sugar was 117 2hr. pcL today Encouraged her to do a reading later in the day-like 2hr. pcS or at HS.  Lance BoschShea greed to do this.   She was shown how to use a insulin pen and had no questions.

## 2017-08-08 ENCOUNTER — Ambulatory Visit: Payer: Medicare HMO | Admitting: Endocrinology

## 2017-10-15 ENCOUNTER — Other Ambulatory Visit: Payer: Self-pay | Admitting: *Deleted

## 2017-10-15 DIAGNOSIS — G5621 Lesion of ulnar nerve, right upper limb: Secondary | ICD-10-CM

## 2017-10-22 ENCOUNTER — Ambulatory Visit: Payer: Medicare HMO | Admitting: Endocrinology

## 2017-10-26 ENCOUNTER — Encounter: Payer: Self-pay | Admitting: Endocrinology

## 2017-10-26 ENCOUNTER — Ambulatory Visit: Payer: Medicare HMO | Admitting: Endocrinology

## 2017-10-26 VITALS — BP 170/102 | HR 85 | Wt 174.2 lb

## 2017-10-26 DIAGNOSIS — E1169 Type 2 diabetes mellitus with other specified complication: Secondary | ICD-10-CM | POA: Diagnosis not present

## 2017-10-26 DIAGNOSIS — E669 Obesity, unspecified: Secondary | ICD-10-CM | POA: Diagnosis not present

## 2017-10-26 LAB — POCT GLYCOSYLATED HEMOGLOBIN (HGB A1C): Hemoglobin A1C: 7.4

## 2017-10-26 MED ORDER — GLUCOSE BLOOD VI STRP: 1.0000 | ORAL_STRIP | Freq: Every day | 12 refills | Status: DC

## 2017-10-26 MED ORDER — METFORMIN HCL ER 500 MG PO TB24: 500.0000 mg | ORAL_TABLET | Freq: Every day | ORAL | 3 refills | Status: DC

## 2017-10-26 NOTE — Progress Notes (Signed)
Subjective:    Patient ID: Terri Daniel, female    DOB: 10-25-1955, 62 y.o.   MRN: [id number]  HPI Pt returns for f/u of diabetes mellitus: DM type: 2 Dx'ed: 2013 Complications: none Therapy: 4 oral meds GDM: never DKA: never Severe hypoglycemia: never Pancreatitis: never Pancreatic imaging: never Other: she has never taken insulin, but she has learned how; metformin-XR dosage is limited by diarrhea.She says she cannot afford brand-name medications.     Interval history: she brings a record of her cbg's which I have reviewed today.  It varies from 45-147.  It is lowest in the afternoon.  She takes meds very inconsistently.  Past Medical History:  Diagnosis Date  . Anxiety   . Arthritis   . Asthma   . Bulging lumbar disc   . Chronic interstitial cystitis   . Depression   . Diabetes mellitus without complication (HCC)   . Disorder of left rotator cuff   . Fibromyalgia   . GERD (gastroesophageal reflux disease)   . HLD (hyperlipidemia)   . Hypertension   . Tobacco abuse   . Transaminitis     Past Surgical History:  Procedure Laterality Date  . CYSTOSCOPY    . JOINT REPLACEMENT Left 2009   Knee  . LEFT HEART CATH AND CORONARY ANGIOGRAPHY N/A 02/07/2017   Procedure: Left Heart Cath and Coronary Angiography;  Surgeon: Lennette BihariKelly, Thomas A, MD;  [facility];  Service: Cardiovascular;  Laterality: N/A;    Social History   Socioeconomic History  . Marital status: Divorced    Spouse name: Not on file  . Number of children: 2  . Years of education: 2516  . Highest education level: Not on file  Social Needs  . Financial resource strain: Not on file  . Food insecurity - worry: Not on file  . Food insecurity - inability: Not on file  . Transportation needs - medical: Not on file  . Transportation needs - non-medical: Not on file  Occupational History  . Occupation: disabled  Tobacco Use  . Smoking status: Current Every Day Smoker    Packs/day: 0.50   Types: Cigarettes  . Smokeless tobacco: Never Used  . Tobacco comment: 27 years total as of 2018  Substance and Sexual Activity  . Alcohol use: No  . Drug use: No  . Sexual activity: Not on file  Other Topics Concern  . Not on file  Social History Narrative   Lives alone in a one story home.  Has 2 children.  On disability due to interstitial cystitis.  Education: college.     Current Outpatient Medications on File Prior to Visit  Medication Sig Dispense Refill  . bimatoprost (LUMIGAN) 0.03 % ophthalmic solution Place 1 drop into both eyes at bedtime.    . bromocriptine (PARLODEL) 2.5 MG tablet 1/4 tab daily 8 tablet 11  . cetirizine (ZYRTEC) 10 MG tablet Take 10 mg by mouth daily.    . citalopram (CELEXA) 40 MG tablet Take 40 mg daily by mouth.    . cloNIDine (CATAPRES) 0.1 MG tablet Take 0.1 mg by mouth daily.    . cyclobenzaprine (FLEXERIL) 5 MG tablet Take 5 mg by mouth at bedtime.  0  . diclofenac sodium (VOLTAREN) 1 % GEL Place 1 application onto the skin 2 (two) times daily as needed.    Marland Kitchen. FLUoxetine (PROZAC) 20 MG capsule Take 40 mg by mouth.     . hydrOXYzine (ATARAX/VISTARIL) 25 MG tablet Take 25 mg  once as needed by mouth.    Marland Kitchen lisinopril-hydrochlorothiazide (PRINZIDE,ZESTORETIC) 20-12.5 MG tablet Take 2 tablets by mouth daily.    . meloxicam (MOBIC) 15 MG tablet Take 15 mg by mouth daily.  3  . Multiple Vitamin (MULTIVITAMIN WITH MINERALS) TABS tablet Take 1 tablet by mouth daily.    . Multiple Vitamins-Minerals (HAIR/SKIN/NAILS/BIOTIN PO) Take 1 tablet by mouth daily.    Marland Kitchen omeprazole (PRILOSEC) 40 MG capsule     . pioglitazone (ACTOS) 15 MG tablet Take 1 tablet (15 mg total) daily by mouth. 30 tablet 11  . rosuvastatin (CRESTOR) 40 MG tablet Take 40 mg by mouth daily.     No current facility-administered medications on file prior to visit.     Allergies  Allergen Reactions  . Other Shortness Of Breath  . Doxycycline Nausea And Vomiting  . Erythromycin Base Rash  .  Sulfa Antibiotics Rash  . Contrast Media [Iodinated Diagnostic Agents] Hives    CT in 2004  . Prednisone     Heart pounding, rash     Family History  Problem Relation Age of Onset  . CAD Mother        stent in her 43s  . Atrial fibrillation Mother   . CAD Father        CABG age 45  . Diabetes Father   . CAD Brother        "widow maker" MI before age 94 requiring stent  . Diabetes Mellitus II Brother     BP (!) 170/102 (BP Location: Left Arm, Patient Position: Sitting, Cuff Size: Normal)   Pulse 85   Wt 174 lb 3.2 oz (79 kg)   SpO2 97%   BMI 31.86 kg/m   Review of Systems She denies hypoglycemia.  No weight change.      Objective:   Physical Exam VITAL SIGNS:  See vs page GENERAL: no distress Pulses: dorsalis pedis intact bilat.   MSK: no deformity of the feet CV: no leg edema Skin:  no ulcer on the feet.  normal color and temp on the feet. Neuro: sensation is intact to touch on the feet.  Lab Results  Component Value Date   HGBA1C 7.4 10/26/2017       Assessment & Plan:  HTN: is noted today Type 2 DM: she needs increased rx, if it can be done with a regimen that avoids or minimizes hypoglycemia. Noncompliance with cbg meds: this complicates the rx of DM.   Patient Instructions  Your blood pressure is high today.  Please see your primary care provider soon, to have it rechecked check your blood sugar once a day.  vary the time of day when you check, between before the 3 meals, and at bedtime.  also check if you have symptoms of your blood sugar being too high or too low.  please keep a record of the readings and bring it to your next appointment here (or you can bring the meter itself).  You can write it on any piece of paper.  please call us sooner if your blood sugar goes below 70, or if you have a lot of readings over 200.   Please stay off the glimepiride, but resume the other 3 diabetes meds.   Please come back for a follow-up appointment in 3 months.

## 2017-10-26 NOTE — Patient Instructions (Addendum)
Your blood pressure is high today.  Please see your primary care provider soon, to have it rechecked check your blood sugar once a day.  vary the time of day when you check, between before the 3 meals, and at bedtime.  also check if you have symptoms of your blood sugar being too high or too low.  please keep a record of the readings and bring it to your next appointment here (or you can bring the meter itself).  You can write it on any piece of paper.  please call us sooner if your blood sugar goes below 70, or if you have a lot of readings over 200.   Please stay off the glimepiride, but resume the other 3 diabetes meds.   Please come back for a follow-up appointment in 3 months.

## 2018-01-15 ENCOUNTER — Ambulatory Visit: Payer: Medicare HMO | Admitting: Endocrinology

## 2018-01-15 ENCOUNTER — Encounter: Payer: Self-pay | Admitting: Endocrinology

## 2018-01-15 VITALS — BP 130/86 | HR 76 | Ht 62.0 in | Wt 174.0 lb

## 2018-01-15 DIAGNOSIS — E1169 Type 2 diabetes mellitus with other specified complication: Secondary | ICD-10-CM | POA: Diagnosis not present

## 2018-01-15 DIAGNOSIS — E669 Obesity, unspecified: Secondary | ICD-10-CM

## 2018-01-15 LAB — POCT GLYCOSYLATED HEMOGLOBIN (HGB A1C): Hemoglobin A1C: 6.4

## 2018-01-15 NOTE — Progress Notes (Signed)
Subjective:    Patient ID: Terri Daniel, female    DOB: 08/26/1956, 62 y.o.   MRN: [id number]  HPI Pt returns for f/u of diabetes mellitus: DM type: 2 Dx'ed: 2013 Complications: none Therapy: 4 oral meds GDM: never DKA: never Severe hypoglycemia: never Pancreatitis: never Pancreatic imaging: never Other: she has never taken insulin, but she has learned how; metformin-XR dosage is limited by diarrhea.She says she cannot afford brand-name medications.     Interval history: Meter is downloaded today, and the printout is scanned into the record.  It varies from 87-124.  There is no trend throughout the day.  She takes meds very inconsistently.  Past Medical History:  Diagnosis Date  . Anxiety   . Arthritis   . Asthma   . Bulging lumbar disc   . Chronic interstitial cystitis   . Depression   . Diabetes mellitus without complication (HCC)   . Disorder of left rotator cuff   . Fibromyalgia   . GERD (gastroesophageal reflux disease)   . HLD (hyperlipidemia)   . Hypertension   . Tobacco abuse   . Transaminitis     Past Surgical History:  Procedure Laterality Date  . CYSTOSCOPY    . JOINT REPLACEMENT Left 2009   Knee  . LEFT HEART CATH AND CORONARY ANGIOGRAPHY N/A 02/07/2017   Procedure: Left Heart Cath and Coronary Angiography;  Surgeon: Lennette Bihari, MD;  Location: [facility] INVASIVE CV LAB;  Service: Cardiovascular;  Laterality: N/A;    Social History   Socioeconomic History  . Marital status: Divorced    Spouse name: Not on file  . Number of children: 2  . Years of education: 13  . Highest education level: Not on file  Occupational History  . Occupation: disabled  Social Needs  . Financial resource strain: Not on file  . Food insecurity:    Worry: Not on file    Inability: Not on file  . Transportation needs:    Medical: Not on file    Non-medical: Not on file  Tobacco Use  . Smoking status: Current Every Day Smoker    Packs/day: 0.50    Types: Cigarettes    . Smokeless tobacco: Never Used  . Tobacco comment: 27 years total as of 2018  Substance and Sexual Activity  . Alcohol use: No  . Drug use: No  . Sexual activity: Not on file  Lifestyle  . Physical activity:    Days per week: Not on file    Minutes per session: Not on file  . Stress: Not on file  Relationships  . Social connections:    Talks on phone: Not on file    Gets together: Not on file    Attends religious service: Not on file    Active member of club or organization: Not on file    Attends meetings of clubs or organizations: Not on file    Relationship status: Not on file  . Intimate partner violence:    Fear of current or ex partner: Not on file    Emotionally abused: Not on file    Physically abused: Not on file    Forced sexual activity: Not on file  Other Topics Concern  . Not on file  Social History Narrative   Lives alone in a one story home.  Has 2 children.  On disability due to interstitial cystitis.  Education: college.     Current Outpatient Medications on File Prior to Visit  Medication Sig Dispense  Refill  . bimatoprost (LUMIGAN) 0.03 % ophthalmic solution Place 1 drop into both eyes at bedtime.    . bromocriptine (PARLODEL) 2.5 MG tablet 1/4 tab daily 8 tablet 11  . citalopram (CELEXA) 40 MG tablet Take 40 mg daily by mouth.    . cyclobenzaprine (FLEXERIL) 5 MG tablet Take 5 mg by mouth at bedtime.  0  . diclofenac sodium (VOLTAREN) 1 % GEL Place 1 application onto the skin 2 (two) times daily as needed.    Marland Kitchen glucose blood (ACCU-CHEK GUIDE) test strip 1 each by Other route daily. And lancets 1/day 100 each 12  . hydrOXYzine (ATARAX/VISTARIL) 25 MG tablet Take 25 mg once as needed by mouth.    Marland Kitchen lisinopril-hydrochlorothiazide (PRINZIDE,ZESTORETIC) 20-12.5 MG tablet Take 2 tablets by mouth daily.    . meloxicam (MOBIC) 15 MG tablet Take 15 mg by mouth daily.  3  . metFORMIN (GLUCOPHAGE-XR) 500 MG 24 hr tablet Take 1 tablet (500 mg total) by mouth daily  with breakfast. 90 tablet 3  . Multiple Vitamin (MULTIVITAMIN WITH MINERALS) TABS tablet Take 1 tablet by mouth daily.    . Multiple Vitamins-Minerals (HAIR/SKIN/NAILS/BIOTIN PO) Take 1 tablet by mouth daily.    Marland Kitchen omeprazole (PRILOSEC) 40 MG capsule     . pioglitazone (ACTOS) 15 MG tablet Take 1 tablet (15 mg total) daily by mouth. 30 tablet 11  . rosuvastatin (CRESTOR) 40 MG tablet Take 40 mg by mouth daily.    . cetirizine (ZYRTEC) 10 MG tablet Take 10 mg by mouth daily.     No current facility-administered medications on file prior to visit.     Allergies  Allergen Reactions  . Other Shortness Of Breath  . Propoxyphene Shortness Of Breath  . Doxycycline Nausea And Vomiting  . Erythromycin Base Rash  . Sulfa Antibiotics Rash  . Contrast Media [Iodinated Diagnostic Agents] Hives    CT in 2004  . Prednisone     Heart pounding, rash     Family History  Problem Relation Age of Onset  . CAD Mother        stent in her 67s  . Atrial fibrillation Mother   . CAD Father        CABG age 26  . Diabetes Father   . CAD Brother        "widow maker" MI before age 48 requiring stent  . Diabetes Mellitus II Brother     BP 130/86 (BP Location: Left Arm, Patient Position: Sitting, Cuff Size: Normal)   Pulse 76   Ht  (1.575 m)   Wt 174 lb (78.9 kg)   SpO2 97%   BMI 31.83 kg/m    Review of Systems No weight change    Objective:   Physical Exam VITAL SIGNS:  See vs page GENERAL: no distress Pulses: dorsalis pedis intact bilat.   MSK: no deformity of the feet CV: no leg edema Skin:  no ulcer on the feet.  normal color and temp on the feet. Neuro: sensation is intact to touch on the feet  A1c=6.4%    Assessment & Plan:  Type 2 DM: well-controlled.  Diarrhea: this limits metformin dosage.    Patient Instructions  check your blood sugar once a day.  vary the time of day when you check, between before the 3 meals, and at bedtime.  also check if you have symptoms of your  blood sugar being too high or too low.  please keep a record of the readings and  bring it to your next appointment here (or you can bring the meter itself).  You can write it on any piece of paper.  please call us sooner if your blood sugar goes below 70, or if you have a lot of readings over 200.   Please continue the same medications.  Please come back for a follow-up appointment in 4 months.

## 2018-01-15 NOTE — Patient Instructions (Addendum)

## 2018-03-29 ENCOUNTER — Other Ambulatory Visit: Payer: Self-pay

## 2018-03-29 MED ORDER — METFORMIN HCL ER 500 MG PO TB24: 500.0000 mg | ORAL_TABLET | Freq: Every day | ORAL | 3 refills | Status: DC

## 2018-04-16 DIAGNOSIS — A6004 Herpesviral vulvovaginitis: Secondary | ICD-10-CM | POA: Insufficient documentation

## 2018-04-16 DIAGNOSIS — A630 Anogenital (venereal) warts: Secondary | ICD-10-CM | POA: Insufficient documentation

## 2018-05-27 ENCOUNTER — Encounter: Payer: Self-pay | Admitting: Endocrinology

## 2018-05-27 ENCOUNTER — Ambulatory Visit (INDEPENDENT_AMBULATORY_CARE_PROVIDER_SITE_OTHER): Payer: Medicare HMO | Admitting: Endocrinology

## 2018-05-27 VITALS — BP 136/90 | HR 68 | Ht 62.0 in | Wt 174.8 lb

## 2018-05-27 DIAGNOSIS — E1169 Type 2 diabetes mellitus with other specified complication: Secondary | ICD-10-CM | POA: Diagnosis not present

## 2018-05-27 DIAGNOSIS — E669 Obesity, unspecified: Secondary | ICD-10-CM | POA: Diagnosis not present

## 2018-05-27 LAB — POCT GLYCOSYLATED HEMOGLOBIN (HGB A1C): Hemoglobin A1C: 6.1 % — AB (ref 4.0–5.6)

## 2018-05-27 NOTE — Patient Instructions (Signed)

## 2018-05-27 NOTE — Progress Notes (Signed)
Subjective:    Patient ID: Terri Daniel, female    DOB: 1956-03-16, 62 y.o.   MRN: [id number]  HPI Pt returns for f/u of diabetes mellitus: DM type: 2 Dx'ed: 2013 Complications: none Therapy: 3 oral meds GDM: never DKA: never Severe hypoglycemia: never Pancreatitis: never Pancreatic imaging: never Other: she has never taken insulin, but she has learned how; metformin-XR dosage is limited by diarrhea. She says she cannot afford brand-name medications.     Interval history: Meter is downloaded today, and the printout is scanned into the record.  It varies from 114-140.  There is no trend throughout the day.  She seldom misses meds.   Past Medical History:  Diagnosis Date  . Anxiety   . Arthritis   . Asthma   . Bulging lumbar disc   . Chronic interstitial cystitis   . Depression   . Diabetes mellitus without complication (HCC)   . Disorder of left rotator cuff   . Fibromyalgia   . GERD (gastroesophageal reflux disease)   . HLD (hyperlipidemia)   . Hypertension   . Tobacco abuse   . Transaminitis     Past Surgical History:  Procedure Laterality Date  . CYSTOSCOPY    . JOINT REPLACEMENT Left 2009   Knee  . LEFT HEART CATH AND CORONARY ANGIOGRAPHY N/A 02/07/2017   Procedure: Left Heart Cath and Coronary Angiography;  Surgeon: Lennette BihariKelly, Thomas A, MD;  [facility];  Service: Cardiovascular;  Laterality: N/A;    Social History   Socioeconomic History  . Marital status: Divorced    Spouse name: Not on file  . Number of children: 2  . Years of education: 1216  . Highest education level: Not on file  Occupational History  . Occupation: disabled  Social Needs  . Financial resource strain: Not on file  . Food insecurity:    Worry: Not on file    Inability: Not on file  . Transportation needs:    Medical: Not on file    Non-medical: Not on file  Tobacco Use  . Smoking status: Current Every Day Smoker    Packs/day: 0.50    Types: Cigarettes  .  Smokeless tobacco: Never Used  . Tobacco comment: 27 years total as of 2018  Substance and Sexual Activity  . Alcohol use: No  . Drug use: No  . Sexual activity: Not on file  Lifestyle  . Physical activity:    Days per week: Not on file    Minutes per session: Not on file  . Stress: Not on file  Relationships  . Social connections:    Talks on phone: Not on file    Gets together: Not on file    Attends religious service: Not on file    Active member of club or organization: Not on file    Attends meetings of clubs or organizations: Not on file    Relationship status: Not on file  . Intimate partner violence:    Fear of current or ex partner: Not on file    Emotionally abused: Not on file    Physically abused: Not on file    Forced sexual activity: Not on file  Other Topics Concern  . Not on file  Social History Narrative   Lives alone in a one story home.  Has 2 children.  On disability due to interstitial cystitis.  Education: college.     Current Outpatient Medications on File Prior to Visit  Medication Sig Dispense  Refill  . bromocriptine (PARLODEL) 2.5 MG tablet 1/4 tab daily 8 tablet 11  . citalopram (CELEXA) 40 MG tablet Take 40 mg daily by mouth.    . cyclobenzaprine (FLEXERIL) 5 MG tablet Take 5 mg by mouth at bedtime.  0  . diclofenac sodium (VOLTAREN) 1 % GEL Place 1 application onto the skin 2 (two) times daily as needed.    Marland Kitchen glucose blood (ACCU-CHEK GUIDE) test strip 1 each by Other route daily. And lancets 1/day 100 each 12  . hydrOXYzine (ATARAX/VISTARIL) 25 MG tablet Take 25 mg once as needed by mouth.    Marland Kitchen lisinopril-hydrochlorothiazide (PRINZIDE,ZESTORETIC) 20-12.5 MG tablet Take 2 tablets by mouth daily.    . meloxicam (MOBIC) 15 MG tablet Take 15 mg by mouth daily.  3  . metFORMIN (GLUCOPHAGE-XR) 500 MG 24 hr tablet Take 1 tablet (500 mg total) by mouth daily with breakfast. 90 tablet 3  . Multiple Vitamin (MULTIVITAMIN WITH MINERALS) TABS tablet Take 1  tablet by mouth daily.    . Multiple Vitamins-Minerals (HAIR/SKIN/NAILS/BIOTIN PO) Take 1 tablet by mouth daily.    Marland Kitchen omeprazole (PRILOSEC) 40 MG capsule     . pioglitazone (ACTOS) 15 MG tablet Take 1 tablet (15 mg total) daily by mouth. 30 tablet 11  . rosuvastatin (CRESTOR) 40 MG tablet Take 40 mg by mouth daily.    . cetirizine (ZYRTEC) 10 MG tablet Take 10 mg by mouth daily.     No current facility-administered medications on file prior to visit.     Allergies  Allergen Reactions  . Other Shortness Of Breath  . Propoxyphene Shortness Of Breath  . Doxycycline Nausea And Vomiting  . Erythromycin Base Rash  . Sulfa Antibiotics Rash  . Contrast Media [Iodinated Diagnostic Agents] Hives    CT in 2004  . Prednisone     Heart pounding, rash     Family History  Problem Relation Age of Onset  . CAD Mother        stent in her 62s  . Atrial fibrillation Mother   . CAD Father        CABG age 68  . Diabetes Father   . CAD Brother        "widow maker" MI before age 44 requiring stent  . Diabetes Mellitus II Brother     BP 136/90   Pulse 68   Ht 5\' 2"  (1.575 m)   Wt 174 lb 12.8 oz (79.3 kg)   SpO2 96%   BMI 31.97 kg/m    Review of Systems She denies hypoglycemia.    Objective:   Physical Exam VITAL SIGNS:  See vs page GENERAL: no distress Pulses: dorsalis pedis intact bilat.   MSK: no deformity of the feet CV: no leg edema Skin:  no ulcer on the feet.  normal color and temp on the feet. Neuro: sensation is intact to touch on the feet  A1c=6.1%    Assessment & Plan:  Type 2 DM: well-controlled.   HTN: borderline control.  recheck next time   Patient Instructions  check your blood sugar once a day.  vary the time of day when you check, between before the 3 meals, and at bedtime.  also check if you have symptoms of your blood sugar being too high or too low.  please keep a record of the readings and bring it to your next appointment here (or you can bring the  meter itself).  You can write it on any piece of paper.  please call us sooner if your blood sugar goes below 70, or if you have a lot of readings over 200.   Please continue the same medications.  Please come back for a follow-up appointment in 4 months.

## 2018-09-30 ENCOUNTER — Encounter: Payer: Self-pay | Admitting: Endocrinology

## 2018-09-30 ENCOUNTER — Ambulatory Visit (INDEPENDENT_AMBULATORY_CARE_PROVIDER_SITE_OTHER): Payer: Medicare HMO | Admitting: Endocrinology

## 2018-09-30 VITALS — BP 130/88 | HR 83 | Ht 62.0 in | Wt 176.0 lb

## 2018-09-30 DIAGNOSIS — E669 Obesity, unspecified: Secondary | ICD-10-CM | POA: Diagnosis not present

## 2018-09-30 DIAGNOSIS — E1169 Type 2 diabetes mellitus with other specified complication: Secondary | ICD-10-CM | POA: Diagnosis not present

## 2018-09-30 LAB — POCT GLYCOSYLATED HEMOGLOBIN (HGB A1C): HEMOGLOBIN A1C: 6.6 % — AB (ref 4.0–5.6)

## 2018-09-30 NOTE — Patient Instructions (Signed)

## 2018-09-30 NOTE — Progress Notes (Signed)
Subjective:    Patient ID: Terri Daniel, female    DOB: [birth year], 63 y.o.   MRN: [id number]  HPI Pt returns for f/u of diabetes mellitus: DM type: 2 Dx'ed: 2013 Complications: none Therapy: 3 oral meds GDM: never DKA: never Severe hypoglycemia: never Pancreatitis: never Pancreatic imaging: never Other: she has never taken insulin, but she has learned how; metformin-XR dosage is limited by diarrhea. She says she cannot afford brand-name medications.     Interval history: Meter is downloaded today, and the printout is scanned into the record.  It varies from 87-255, but almost all are in the low-100's.  There is no trend throughout the day.  She has had resp illnesses since last ov here, but no recent steroids.   Past Medical History:  Diagnosis Date  . Anxiety   . Arthritis   . Asthma   . Bulging lumbar disc   . Chronic interstitial cystitis   . Depression   . Diabetes mellitus without complication (HCC)   . Disorder of left rotator cuff   . Fibromyalgia   . GERD (gastroesophageal reflux disease)   . HLD (hyperlipidemia)   . Hypertension   . Tobacco abuse   . Transaminitis     Past Surgical History:  Procedure Laterality Date  . CYSTOSCOPY    . JOINT REPLACEMENT Left 2009   Knee  . LEFT HEART CATH AND CORONARY ANGIOGRAPHY N/A 02/07/2017   Procedure: Left Heart Cath and Coronary Angiography;  Surgeon: Lennette BihariKelly, Thomas A, MD;  [facility];  Service: Cardiovascular;  Laterality: N/A;    Social History   Socioeconomic History  . Marital status: Divorced    Spouse name: Not on file  . Number of children: 2  . Years of education: 8216  . Highest education level: Not on file  Occupational History  . Occupation: disabled  Social Needs  . Financial resource strain: Not on file  . Food insecurity:    Worry: Not on file    Inability: Not on file  . Transportation needs:    Medical: Not on file    Non-medical: Not on file  Tobacco Use  . Smoking  status: Current Every Day Smoker    Packs/day: 0.50    Types: Cigarettes  . Smokeless tobacco: Never Used  . Tobacco comment: 27 years total as of 2018  Substance and Sexual Activity  . Alcohol use: No  . Drug use: No  . Sexual activity: Not on file  Lifestyle  . Physical activity:    Days per week: Not on file    Minutes per session: Not on file  . Stress: Not on file  Relationships  . Social connections:    Talks on phone: Not on file    Gets together: Not on file    Attends religious service: Not on file    Active member of club or organization: Not on file    Attends meetings of clubs or organizations: Not on file    Relationship status: Not on file  . Intimate partner violence:    Fear of current or ex partner: Not on file    Emotionally abused: Not on file    Physically abused: Not on file    Forced sexual activity: Not on file  Other Topics Concern  . Not on file  Social History Narrative   Lives alone in a one story home.  Has 2 children.  On disability due to interstitial cystitis.  Education: college.  Current Outpatient Medications on File Prior to Visit  Medication Sig Dispense Refill  . albuterol (PROVENTIL HFA;VENTOLIN HFA) 108 (90 Base) MCG/ACT inhaler Inhale into the lungs every 6 (six) hours as needed for wheezing or shortness of breath.    . bromocriptine (PARLODEL) 2.5 MG tablet 1/4 tab daily 8 tablet 11  . buPROPion (WELLBUTRIN XL) 150 MG 24 hr tablet Take 150 mg by mouth daily.    . cetirizine (ZYRTEC) 10 MG tablet Take 10 mg by mouth daily.    . citalopram (CELEXA) 40 MG tablet Take 40 mg daily by mouth.    . cyclobenzaprine (FLEXERIL) 5 MG tablet Take 5 mg by mouth at bedtime.  0  . diclofenac sodium (VOLTAREN) 1 % GEL Place 1 application onto the skin 2 (two) times daily as needed.    . ezetimibe (ZETIA) 10 MG tablet Take 10 mg by mouth daily.    . fluticasone (FLONASE) 50 MCG/ACT nasal spray Place into both nostrils daily.    Marland Kitchen glucose blood  (ACCU-CHEK GUIDE) test strip 1 each by Other route daily. And lancets 1/day 100 each 12  . hydrOXYzine (ATARAX/VISTARIL) 25 MG tablet Take 25 mg once as needed by mouth.    Marland Kitchen Lifitegrast (XIIDRA) 5 % SOLN Apply 1 drop to eye.    Marland Kitchen lisinopril-hydrochlorothiazide (PRINZIDE,ZESTORETIC) 20-12.5 MG tablet Take 2 tablets by mouth daily.    . meloxicam (MOBIC) 15 MG tablet Take 15 mg by mouth daily.  3  . metFORMIN (GLUCOPHAGE-XR) 500 MG 24 hr tablet Take 1 tablet (500 mg total) by mouth daily with breakfast. 90 tablet 3  . Nutritional Supplements (NUTRITIONAL SUPPLEMENT PO) Take 1,250 mg by mouth.    Marland Kitchen omeprazole (PRILOSEC) 40 MG capsule     . pioglitazone (ACTOS) 15 MG tablet Take 1 tablet (15 mg total) daily by mouth. 30 tablet 11  . rosuvastatin (CRESTOR) 40 MG tablet Take 40 mg by mouth daily.    . valACYclovir (VALTREX) 1000 MG tablet Take 1,000 mg by mouth 2 (two) times daily.     No current facility-administered medications on file prior to visit.     Allergies  Allergen Reactions  . Other Shortness Of Breath  . Propoxyphene Shortness Of Breath  . Doxycycline Nausea And Vomiting  . Erythromycin Base Rash  . Sulfa Antibiotics Rash  . Contrast Media [Iodinated Diagnostic Agents] Hives    CT in 2004  . Prednisone     Heart pounding, rash     Family History  Problem Relation Age of Onset  . CAD Mother        stent in her 34s  . Atrial fibrillation Mother   . CAD Father        CABG age 62  . Diabetes Father   . CAD Brother        "widow maker" MI before age 38 requiring stent  . Diabetes Mellitus II Brother     BP 130/88 (BP Location: Left Arm, Patient Position: Sitting, Cuff Size: Large)   Pulse 83   Ht 5\' 2"  (1.575 m)   Wt 176 lb (79.8 kg)   SpO2 96%   BMI 32.19 kg/m   Review of Systems She denies hypoglycemia.     Objective:   Physical Exam VITAL SIGNS:  See vs page GENERAL: no distress Pulses: dorsalis pedis intact bilat.   MSK: no deformity of the feet CV:  no leg edema Skin:  no ulcer on the feet.  normal color and temp on  the feet. Neuro: sensation is intact to touch on the feet.    Lab Results  Component Value Date   CREATININE 0.97 02/07/2017   BUN 16 02/07/2017   NA 134 (L) 02/07/2017   K 3.5 02/07/2017   CL 101 02/07/2017   CO2 22 02/07/2017     Lab Results  Component Value Date   HGBA1C 6.6 (A) 09/30/2018       Assessment & Plan:  Type 2 DM: well-controlled Diarrhea, by hx.  This limits rx options  Patient Instructions  check your blood sugar once a day.  vary the time of day when you check, between before the 3 meals, and at bedtime.  also check if you have symptoms of your blood sugar being too high or too low.  please keep a record of the readings and bring it to your next appointment here (or you can bring the meter itself).  You can write it on any piece of paper.  please call us sooner if your blood sugar goes below 70, or if you have a lot of readings over 200.   Please continue the same medications.  Please come back for a follow-up appointment in 4 months.

## 2018-10-20 DIAGNOSIS — M1711 Unilateral primary osteoarthritis, right knee: Secondary | ICD-10-CM | POA: Diagnosis not present

## 2018-11-01 DIAGNOSIS — Z7984 Long term (current) use of oral hypoglycemic drugs: Secondary | ICD-10-CM | POA: Diagnosis not present

## 2018-11-01 DIAGNOSIS — E1165 Type 2 diabetes mellitus with hyperglycemia: Secondary | ICD-10-CM | POA: Diagnosis not present

## 2018-11-01 DIAGNOSIS — F329 Major depressive disorder, single episode, unspecified: Secondary | ICD-10-CM | POA: Diagnosis not present

## 2018-11-01 DIAGNOSIS — I10 Essential (primary) hypertension: Secondary | ICD-10-CM | POA: Diagnosis not present

## 2018-11-07 ENCOUNTER — Other Ambulatory Visit: Payer: Self-pay | Admitting: Endocrinology

## 2018-12-02 DIAGNOSIS — I10 Essential (primary) hypertension: Secondary | ICD-10-CM | POA: Diagnosis not present

## 2018-12-02 DIAGNOSIS — F329 Major depressive disorder, single episode, unspecified: Secondary | ICD-10-CM | POA: Diagnosis not present

## 2018-12-02 DIAGNOSIS — E1165 Type 2 diabetes mellitus with hyperglycemia: Secondary | ICD-10-CM | POA: Diagnosis not present

## 2018-12-02 DIAGNOSIS — Z7984 Long term (current) use of oral hypoglycemic drugs: Secondary | ICD-10-CM | POA: Diagnosis not present

## 2018-12-03 DIAGNOSIS — R05 Cough: Secondary | ICD-10-CM | POA: Diagnosis not present

## 2018-12-24 DIAGNOSIS — H2513 Age-related nuclear cataract, bilateral: Secondary | ICD-10-CM | POA: Diagnosis not present

## 2018-12-24 DIAGNOSIS — H04123 Dry eye syndrome of bilateral lacrimal glands: Secondary | ICD-10-CM | POA: Diagnosis not present

## 2018-12-24 DIAGNOSIS — H401131 Primary open-angle glaucoma, bilateral, mild stage: Secondary | ICD-10-CM | POA: Diagnosis not present

## 2018-12-24 DIAGNOSIS — H40053 Ocular hypertension, bilateral: Secondary | ICD-10-CM | POA: Diagnosis not present

## 2018-12-31 DIAGNOSIS — H40051 Ocular hypertension, right eye: Secondary | ICD-10-CM | POA: Diagnosis not present

## 2018-12-31 DIAGNOSIS — H401111 Primary open-angle glaucoma, right eye, mild stage: Secondary | ICD-10-CM | POA: Diagnosis not present

## 2019-01-14 DIAGNOSIS — H401121 Primary open-angle glaucoma, left eye, mild stage: Secondary | ICD-10-CM | POA: Diagnosis not present

## 2019-01-14 DIAGNOSIS — H40052 Ocular hypertension, left eye: Secondary | ICD-10-CM | POA: Diagnosis not present

## 2019-01-29 ENCOUNTER — Ambulatory Visit (INDEPENDENT_AMBULATORY_CARE_PROVIDER_SITE_OTHER): Payer: Medicare HMO | Admitting: Endocrinology

## 2019-01-29 ENCOUNTER — Other Ambulatory Visit: Payer: Self-pay

## 2019-01-29 DIAGNOSIS — E669 Obesity, unspecified: Secondary | ICD-10-CM | POA: Diagnosis not present

## 2019-01-29 DIAGNOSIS — E1169 Type 2 diabetes mellitus with other specified complication: Secondary | ICD-10-CM

## 2019-01-29 NOTE — Patient Instructions (Signed)
check your blood sugar once a day.  vary the time of day when you check, between before the 3 meals, and at bedtime.  also check if you have symptoms of your blood sugar being too high or too low.  please keep a record of the readings and bring it to your next appointment here (or you can bring the meter itself).  You can write it on any piece of paper.  please call us sooner if your blood sugar goes below 70, or if you have a lot of readings over 200.   Please come in to hav the a1c checked.   Please continue the same medications.  Please come back for a follow-up appointment in 4 months.

## 2019-01-29 NOTE — Progress Notes (Signed)
Subjective:    Patient ID: Terri Daniel, female    DOB: 26-May-1956, 63 y.o.   MRN: [id number]  HPI  telehealth visit today via doxy video visit.  Alternatives to telehealth are presented to this patient, and the patient agrees to the telehealth visit. Pt is advised of the cost of the visit, and agrees to this, also.   Patient is at home, and I am at the office.   Persons attending the telehealth visit: the patient and I Pt returns for f/u of diabetes mellitus: DM type: 2 Dx'ed: 2013 Complications: none.   Therapy: 3 oral meds GDM: never DKA: never Severe hypoglycemia: never Pancreatitis: never Pancreatic imaging: never Other: she has never taken insulin, but she has learned how; metformin-XR dosage is limited by diarrhea. She says she cannot afford brand-name medications.   Interval history: pt says almost all cbg's are in the low to mid-100's.  There is no trend throughout the day.  no recent steroids.   Past Medical History:  Diagnosis Date  . Anxiety   . Arthritis   . Asthma   . Bulging lumbar disc   . Chronic interstitial cystitis   . Depression   . Diabetes mellitus without complication (HCC)   . Disorder of left rotator cuff   . Fibromyalgia   . GERD (gastroesophageal reflux disease)   . HLD (hyperlipidemia)   . Hypertension   . Tobacco abuse   . Transaminitis     Past Surgical History:  Procedure Laterality Date  . CYSTOSCOPY    . JOINT REPLACEMENT Left 2009   Knee  . LEFT HEART CATH AND CORONARY ANGIOGRAPHY N/A 02/07/2017   Procedure: Left Heart Cath and Coronary Angiography;  Surgeon: Lennette Bihari, MD;  Location: [facility] INVASIVE CV LAB;  Service: Cardiovascular;  Laterality: N/A;    Social History   Socioeconomic History  . Marital status: Divorced    Spouse name: Not on file  . Number of children: 2  . Years of education: 65  . Highest education level: Not on file  Occupational History  . Occupation: disabled  Social Needs  . Financial resource  strain: Not on file  . Food insecurity:    Worry: Not on file    Inability: Not on file  . Transportation needs:    Medical: Not on file    Non-medical: Not on file  Tobacco Use  . Smoking status: Current Every Day Smoker    Packs/day: 0.50    Types: Cigarettes  . Smokeless tobacco: Never Used  . Tobacco comment: 27 years total as of 2018  Substance and Sexual Activity  . Alcohol use: No  . Drug use: No  . Sexual activity: Not on file  Lifestyle  . Physical activity:    Days per week: Not on file    Minutes per session: Not on file  . Stress: Not on file  Relationships  . Social connections:    Talks on phone: Not on file    Gets together: Not on file    Attends religious service: Not on file    Active member of club or organization: Not on file    Attends meetings of clubs or organizations: Not on file    Relationship status: Not on file  . Intimate partner violence:    Fear of current or ex partner: Not on file    Emotionally abused: Not on file    Physically abused: Not on file    Forced sexual activity:  Not on file  Other Topics Concern  . Not on file  Social History Narrative   Lives alone in a one story home.  Has 2 children.  On disability due to interstitial cystitis.  Education: college.     Current Outpatient Medications on File Prior to Visit  Medication Sig Dispense Refill  . albuterol (PROVENTIL HFA;VENTOLIN HFA) 108 (90 Base) MCG/ACT inhaler Inhale into the lungs every 6 (six) hours as needed for wheezing or shortness of breath.    . bromocriptine (PARLODEL) 2.5 MG tablet 1/4 tab daily 8 tablet 11  . buPROPion (WELLBUTRIN XL) 150 MG 24 hr tablet Take 150 mg by mouth daily.    . cetirizine (ZYRTEC) 10 MG tablet Take 10 mg by mouth daily.    . citalopram (CELEXA) 40 MG tablet Take 40 mg daily by mouth.    . cyclobenzaprine (FLEXERIL) 5 MG tablet Take 5 mg by mouth at bedtime.  0  . diclofenac sodium (VOLTAREN) 1 % GEL Place 1 application onto the skin 2  (two) times daily as needed.    . ezetimibe (ZETIA) 10 MG tablet Take 10 mg by mouth daily.    . fluticasone (FLONASE) 50 MCG/ACT nasal spray Place into both nostrils daily.    Marland Kitchen glucose blood test strip USE AS DIRECTED 100 each 12  . hydrOXYzine (ATARAX/VISTARIL) 25 MG tablet Take 25 mg once as needed by mouth.    Marland Kitchen Lifitegrast (XIIDRA) 5 % SOLN Apply 1 drop to eye.    Marland Kitchen lisinopril-hydrochlorothiazide (PRINZIDE,ZESTORETIC) 20-12.5 MG tablet Take 2 tablets by mouth daily.    . meloxicam (MOBIC) 15 MG tablet Take 15 mg by mouth daily.  3  . metFORMIN (GLUCOPHAGE-XR) 500 MG 24 hr tablet Take 1 tablet (500 mg total) by mouth daily with breakfast. 90 tablet 3  . Nutritional Supplements (NUTRITIONAL SUPPLEMENT PO) Take 1,250 mg by mouth.    Marland Kitchen omeprazole (PRILOSEC) 40 MG capsule     . pioglitazone (ACTOS) 15 MG tablet Take 1 tablet (15 mg total) daily by mouth. 30 tablet 11  . rosuvastatin (CRESTOR) 40 MG tablet Take 40 mg by mouth daily.    . valACYclovir (VALTREX) 1000 MG tablet Take 1,000 mg by mouth 2 (two) times daily.    . pregabalin (LYRICA) 75 MG capsule Take 2 capsules by mouth daily.     No current facility-administered medications on file prior to visit.     Allergies  Allergen Reactions  . Other Shortness Of Breath  . Propoxyphene Shortness Of Breath  . Doxycycline Nausea And Vomiting  . Erythromycin Base Rash  . Sulfa Antibiotics Rash  . Contrast Media [Iodinated Diagnostic Agents] Hives    CT in 2004  . Prednisone     Heart pounding, rash     Family History  Problem Relation Age of Onset  . CAD Mother        stent in her 39s  . Atrial fibrillation Mother   . CAD Father        CABG age 66  . Diabetes Father   . CAD Brother        "widow maker" MI before age 53 requiring stent  . Diabetes Mellitus II Brother       Review of Systems She denies hypoglycemia.      Objective:   Physical Exam       Assessment & Plan:  Type 2 DM: apparently well-controlled.   Diarrhea: has not recurred despite resumption of the metformin.  However,  I hesitate to increase  Patient Instructions  check your blood sugar once a day.  vary the time of day when you check, between before the 3 meals, and at bedtime.  also check if you have symptoms of your blood sugar being too high or too low.  please keep a record of the readings and bring it to your next appointment here (or you can bring the meter itself).  You can write it on any piece of paper.  please call us sooner if your blood sugar goes below 70, or if you have a lot of readings over 200.   Please come in to hav the a1c checked.   Please continue the same medications.  Please come back for a follow-up appointment in 4 months.

## 2019-01-30 DIAGNOSIS — E1165 Type 2 diabetes mellitus with hyperglycemia: Secondary | ICD-10-CM | POA: Diagnosis not present

## 2019-01-30 DIAGNOSIS — F329 Major depressive disorder, single episode, unspecified: Secondary | ICD-10-CM | POA: Diagnosis not present

## 2019-01-30 DIAGNOSIS — Z7984 Long term (current) use of oral hypoglycemic drugs: Secondary | ICD-10-CM | POA: Diagnosis not present

## 2019-01-30 DIAGNOSIS — I10 Essential (primary) hypertension: Secondary | ICD-10-CM | POA: Diagnosis not present

## 2019-03-04 DIAGNOSIS — E1165 Type 2 diabetes mellitus with hyperglycemia: Secondary | ICD-10-CM | POA: Diagnosis not present

## 2019-03-04 DIAGNOSIS — F329 Major depressive disorder, single episode, unspecified: Secondary | ICD-10-CM | POA: Diagnosis not present

## 2019-03-04 DIAGNOSIS — Z7984 Long term (current) use of oral hypoglycemic drugs: Secondary | ICD-10-CM | POA: Diagnosis not present

## 2019-03-04 DIAGNOSIS — I10 Essential (primary) hypertension: Secondary | ICD-10-CM | POA: Diagnosis not present

## 2019-03-06 DIAGNOSIS — R110 Nausea: Secondary | ICD-10-CM | POA: Diagnosis not present

## 2019-03-06 DIAGNOSIS — M797 Fibromyalgia: Secondary | ICD-10-CM | POA: Diagnosis not present

## 2019-03-11 DIAGNOSIS — Z1159 Encounter for screening for other viral diseases: Secondary | ICD-10-CM | POA: Diagnosis not present

## 2019-03-17 DIAGNOSIS — K219 Gastro-esophageal reflux disease without esophagitis: Secondary | ICD-10-CM | POA: Diagnosis not present

## 2019-03-17 DIAGNOSIS — Z7984 Long term (current) use of oral hypoglycemic drugs: Secondary | ICD-10-CM | POA: Diagnosis not present

## 2019-03-17 DIAGNOSIS — I10 Essential (primary) hypertension: Secondary | ICD-10-CM | POA: Diagnosis not present

## 2019-03-17 DIAGNOSIS — E1165 Type 2 diabetes mellitus with hyperglycemia: Secondary | ICD-10-CM | POA: Diagnosis not present

## 2019-03-17 DIAGNOSIS — E7800 Pure hypercholesterolemia, unspecified: Secondary | ICD-10-CM | POA: Diagnosis not present

## 2019-03-17 DIAGNOSIS — F329 Major depressive disorder, single episode, unspecified: Secondary | ICD-10-CM | POA: Diagnosis not present

## 2019-03-17 DIAGNOSIS — M797 Fibromyalgia: Secondary | ICD-10-CM | POA: Diagnosis not present

## 2019-03-18 DIAGNOSIS — E7800 Pure hypercholesterolemia, unspecified: Secondary | ICD-10-CM | POA: Diagnosis not present

## 2019-03-18 DIAGNOSIS — K219 Gastro-esophageal reflux disease without esophagitis: Secondary | ICD-10-CM | POA: Diagnosis not present

## 2019-03-18 DIAGNOSIS — M797 Fibromyalgia: Secondary | ICD-10-CM | POA: Diagnosis not present

## 2019-03-18 DIAGNOSIS — I10 Essential (primary) hypertension: Secondary | ICD-10-CM | POA: Diagnosis not present

## 2019-03-18 DIAGNOSIS — E1165 Type 2 diabetes mellitus with hyperglycemia: Secondary | ICD-10-CM | POA: Diagnosis not present

## 2019-03-18 DIAGNOSIS — Z7984 Long term (current) use of oral hypoglycemic drugs: Secondary | ICD-10-CM | POA: Diagnosis not present

## 2019-03-18 DIAGNOSIS — F329 Major depressive disorder, single episode, unspecified: Secondary | ICD-10-CM | POA: Diagnosis not present

## 2019-03-20 DIAGNOSIS — H40053 Ocular hypertension, bilateral: Secondary | ICD-10-CM | POA: Diagnosis not present

## 2019-03-20 DIAGNOSIS — H401131 Primary open-angle glaucoma, bilateral, mild stage: Secondary | ICD-10-CM | POA: Diagnosis not present

## 2019-03-27 DIAGNOSIS — F3341 Major depressive disorder, recurrent, in partial remission: Secondary | ICD-10-CM | POA: Diagnosis not present

## 2019-03-29 DIAGNOSIS — F329 Major depressive disorder, single episode, unspecified: Secondary | ICD-10-CM | POA: Diagnosis not present

## 2019-03-29 DIAGNOSIS — I10 Essential (primary) hypertension: Secondary | ICD-10-CM | POA: Diagnosis not present

## 2019-03-29 DIAGNOSIS — E1165 Type 2 diabetes mellitus with hyperglycemia: Secondary | ICD-10-CM | POA: Diagnosis not present

## 2019-03-29 DIAGNOSIS — Z7984 Long term (current) use of oral hypoglycemic drugs: Secondary | ICD-10-CM | POA: Diagnosis not present

## 2019-04-25 DIAGNOSIS — Z0100 Encounter for examination of eyes and vision without abnormal findings: Secondary | ICD-10-CM | POA: Diagnosis not present

## 2019-05-13 DIAGNOSIS — F3341 Major depressive disorder, recurrent, in partial remission: Secondary | ICD-10-CM | POA: Diagnosis not present

## 2019-06-19 ENCOUNTER — Other Ambulatory Visit: Payer: Self-pay | Admitting: Endocrinology

## 2019-08-08 DIAGNOSIS — Z20828 Contact with and (suspected) exposure to other viral communicable diseases: Secondary | ICD-10-CM | POA: Diagnosis not present

## 2019-08-12 DIAGNOSIS — E119 Type 2 diabetes mellitus without complications: Secondary | ICD-10-CM | POA: Diagnosis not present

## 2019-08-12 DIAGNOSIS — N3010 Interstitial cystitis (chronic) without hematuria: Secondary | ICD-10-CM | POA: Diagnosis not present

## 2019-08-12 DIAGNOSIS — A6004 Herpesviral vulvovaginitis: Secondary | ICD-10-CM | POA: Diagnosis not present

## 2019-08-12 DIAGNOSIS — A630 Anogenital (venereal) warts: Secondary | ICD-10-CM | POA: Diagnosis not present

## 2019-08-12 DIAGNOSIS — J449 Chronic obstructive pulmonary disease, unspecified: Secondary | ICD-10-CM | POA: Diagnosis not present

## 2019-09-15 ENCOUNTER — Encounter: Payer: Self-pay | Admitting: Endocrinology

## 2019-09-15 ENCOUNTER — Telehealth: Payer: Self-pay

## 2019-09-15 ENCOUNTER — Other Ambulatory Visit: Payer: Self-pay

## 2019-09-15 DIAGNOSIS — E1169 Type 2 diabetes mellitus with other specified complication: Secondary | ICD-10-CM

## 2019-09-15 DIAGNOSIS — E669 Obesity, unspecified: Secondary | ICD-10-CM

## 2019-09-15 MED ORDER — BROMOCRIPTINE MESYLATE 2.5 MG PO TABS: ORAL_TABLET | ORAL | 2 refills | Status: DC

## 2019-09-15 MED ORDER — PIOGLITAZONE HCL 15 MG PO TABS: 15.0000 mg | ORAL_TABLET | Freq: Every day | ORAL | 2 refills | Status: DC

## 2019-09-15 NOTE — Telephone Encounter (Signed)
E-Prescribing Status: Receipt confirmed by pharmacy (09/15/2019  3:23 PM EST)  Both Rx's sent as requested

## 2019-09-15 NOTE — Telephone Encounter (Signed)
MEDICATION: bromocriptine (PARLODEL) 2.5 MG tablet  pioglitazone (ACTOS) 15 MG tablet  PHARMACY:  [id number] - [place], [place] - [address] AT [facility] [address]  IS THIS A 90 DAY SUPPLY :   IS PATIENT OUT OF MEDICATION:   IF NOT; HOW MUCH IS LEFT:   LAST APPOINTMENT DATE: @10 /15/2020  NEXT APPOINTMENT DATE:@1 /28/2021  DO WE HAVE YOUR PERMISSION TO LEAVE A DETAILED MESSAGE:  OTHER COMMENTS:    **Let patient know to contact pharmacy at the end of the day to make sure medication is ready. **  ** Please notify patient to allow 48-72 hours to process**  **Encourage patient to contact the pharmacy for refills or they can request refills through [facility]**

## 2019-09-15 NOTE — Telephone Encounter (Signed)
Please review and advise. It appears you have not refilled either of these since 2018

## 2019-09-15 NOTE — Telephone Encounter (Signed)
1.  Please schedule f/u appt 2.  Then please refill x 1, pending that appt.  

## 2019-09-25 DIAGNOSIS — Z5181 Encounter for therapeutic drug level monitoring: Secondary | ICD-10-CM | POA: Diagnosis not present

## 2019-09-25 DIAGNOSIS — Z79899 Other long term (current) drug therapy: Secondary | ICD-10-CM | POA: Diagnosis not present

## 2019-09-30 ENCOUNTER — Other Ambulatory Visit: Payer: Self-pay

## 2019-09-30 DIAGNOSIS — M25561 Pain in right knee: Secondary | ICD-10-CM | POA: Diagnosis not present

## 2019-09-30 DIAGNOSIS — Z5181 Encounter for therapeutic drug level monitoring: Secondary | ICD-10-CM | POA: Diagnosis not present

## 2019-09-30 DIAGNOSIS — M797 Fibromyalgia: Secondary | ICD-10-CM | POA: Diagnosis not present

## 2019-09-30 DIAGNOSIS — G894 Chronic pain syndrome: Secondary | ICD-10-CM | POA: Diagnosis not present

## 2019-09-30 DIAGNOSIS — Z79899 Other long term (current) drug therapy: Secondary | ICD-10-CM | POA: Diagnosis not present

## 2019-09-30 DIAGNOSIS — M7050 Other bursitis of knee, unspecified knee: Secondary | ICD-10-CM | POA: Diagnosis not present

## 2019-10-02 ENCOUNTER — Ambulatory Visit: Payer: Medicare HMO | Admitting: Endocrinology

## 2019-10-07 DIAGNOSIS — M25561 Pain in right knee: Secondary | ICD-10-CM | POA: Diagnosis not present

## 2019-10-07 DIAGNOSIS — G8929 Other chronic pain: Secondary | ICD-10-CM | POA: Diagnosis not present

## 2019-10-13 DIAGNOSIS — F324 Major depressive disorder, single episode, in partial remission: Secondary | ICD-10-CM | POA: Diagnosis not present

## 2019-10-13 DIAGNOSIS — I10 Essential (primary) hypertension: Secondary | ICD-10-CM | POA: Diagnosis not present

## 2019-10-13 DIAGNOSIS — E7800 Pure hypercholesterolemia, unspecified: Secondary | ICD-10-CM | POA: Diagnosis not present

## 2019-10-13 DIAGNOSIS — Z0000 Encounter for general adult medical examination without abnormal findings: Secondary | ICD-10-CM | POA: Diagnosis not present

## 2019-10-13 DIAGNOSIS — M797 Fibromyalgia: Secondary | ICD-10-CM | POA: Diagnosis not present

## 2019-10-13 DIAGNOSIS — E1165 Type 2 diabetes mellitus with hyperglycemia: Secondary | ICD-10-CM | POA: Diagnosis not present

## 2019-10-13 DIAGNOSIS — Z7984 Long term (current) use of oral hypoglycemic drugs: Secondary | ICD-10-CM | POA: Diagnosis not present

## 2019-10-21 DIAGNOSIS — G8929 Other chronic pain: Secondary | ICD-10-CM | POA: Diagnosis not present

## 2019-10-21 DIAGNOSIS — M25561 Pain in right knee: Secondary | ICD-10-CM | POA: Diagnosis not present

## 2019-10-21 DIAGNOSIS — G894 Chronic pain syndrome: Secondary | ICD-10-CM | POA: Diagnosis not present

## 2019-10-21 DIAGNOSIS — M797 Fibromyalgia: Secondary | ICD-10-CM | POA: Diagnosis not present

## 2019-10-30 DIAGNOSIS — G8929 Other chronic pain: Secondary | ICD-10-CM | POA: Insufficient documentation

## 2019-10-30 DIAGNOSIS — M25561 Pain in right knee: Secondary | ICD-10-CM | POA: Diagnosis not present

## 2019-11-10 DIAGNOSIS — G8929 Other chronic pain: Secondary | ICD-10-CM | POA: Diagnosis not present

## 2019-11-10 DIAGNOSIS — Z881 Allergy status to other antibiotic agents status: Secondary | ICD-10-CM | POA: Diagnosis not present

## 2019-11-10 DIAGNOSIS — E785 Hyperlipidemia, unspecified: Secondary | ICD-10-CM | POA: Diagnosis not present

## 2019-11-10 DIAGNOSIS — M25561 Pain in right knee: Secondary | ICD-10-CM | POA: Diagnosis not present

## 2019-11-10 DIAGNOSIS — J45909 Unspecified asthma, uncomplicated: Secondary | ICD-10-CM | POA: Diagnosis not present

## 2019-11-10 DIAGNOSIS — M1711 Unilateral primary osteoarthritis, right knee: Secondary | ICD-10-CM | POA: Diagnosis not present

## 2019-11-10 DIAGNOSIS — I10 Essential (primary) hypertension: Secondary | ICD-10-CM | POA: Diagnosis not present

## 2019-11-10 DIAGNOSIS — F17210 Nicotine dependence, cigarettes, uncomplicated: Secondary | ICD-10-CM | POA: Diagnosis not present

## 2019-11-10 DIAGNOSIS — E119 Type 2 diabetes mellitus without complications: Secondary | ICD-10-CM | POA: Diagnosis not present

## 2019-11-10 DIAGNOSIS — K219 Gastro-esophageal reflux disease without esophagitis: Secondary | ICD-10-CM | POA: Diagnosis not present

## 2019-11-17 DIAGNOSIS — M797 Fibromyalgia: Secondary | ICD-10-CM | POA: Diagnosis not present

## 2019-11-17 DIAGNOSIS — M25561 Pain in right knee: Secondary | ICD-10-CM | POA: Diagnosis not present

## 2019-11-17 DIAGNOSIS — G8929 Other chronic pain: Secondary | ICD-10-CM | POA: Diagnosis not present

## 2019-12-18 DIAGNOSIS — M792 Neuralgia and neuritis, unspecified: Secondary | ICD-10-CM | POA: Diagnosis not present

## 2019-12-18 DIAGNOSIS — M25561 Pain in right knee: Secondary | ICD-10-CM | POA: Diagnosis not present

## 2019-12-18 DIAGNOSIS — Z5181 Encounter for therapeutic drug level monitoring: Secondary | ICD-10-CM | POA: Diagnosis not present

## 2019-12-18 DIAGNOSIS — G894 Chronic pain syndrome: Secondary | ICD-10-CM | POA: Diagnosis not present

## 2019-12-18 DIAGNOSIS — G8929 Other chronic pain: Secondary | ICD-10-CM | POA: Diagnosis not present

## 2019-12-18 DIAGNOSIS — Z79899 Other long term (current) drug therapy: Secondary | ICD-10-CM | POA: Diagnosis not present

## 2019-12-24 DIAGNOSIS — F324 Major depressive disorder, single episode, in partial remission: Secondary | ICD-10-CM | POA: Diagnosis not present

## 2019-12-24 DIAGNOSIS — E1165 Type 2 diabetes mellitus with hyperglycemia: Secondary | ICD-10-CM | POA: Diagnosis not present

## 2019-12-24 DIAGNOSIS — F329 Major depressive disorder, single episode, unspecified: Secondary | ICD-10-CM | POA: Diagnosis not present

## 2019-12-24 DIAGNOSIS — Z7984 Long term (current) use of oral hypoglycemic drugs: Secondary | ICD-10-CM | POA: Diagnosis not present

## 2019-12-24 DIAGNOSIS — M1990 Unspecified osteoarthritis, unspecified site: Secondary | ICD-10-CM | POA: Diagnosis not present

## 2019-12-24 DIAGNOSIS — E7800 Pure hypercholesterolemia, unspecified: Secondary | ICD-10-CM | POA: Diagnosis not present

## 2019-12-24 DIAGNOSIS — I10 Essential (primary) hypertension: Secondary | ICD-10-CM | POA: Diagnosis not present

## 2019-12-31 DIAGNOSIS — M25561 Pain in right knee: Secondary | ICD-10-CM | POA: Diagnosis not present

## 2019-12-31 DIAGNOSIS — M797 Fibromyalgia: Secondary | ICD-10-CM | POA: Diagnosis not present

## 2019-12-31 DIAGNOSIS — G894 Chronic pain syndrome: Secondary | ICD-10-CM | POA: Diagnosis not present

## 2019-12-31 DIAGNOSIS — M792 Neuralgia and neuritis, unspecified: Secondary | ICD-10-CM | POA: Diagnosis not present

## 2020-01-26 DIAGNOSIS — M25561 Pain in right knee: Secondary | ICD-10-CM | POA: Diagnosis not present

## 2020-01-26 DIAGNOSIS — M1711 Unilateral primary osteoarthritis, right knee: Secondary | ICD-10-CM | POA: Diagnosis not present

## 2020-01-28 DIAGNOSIS — Z79899 Other long term (current) drug therapy: Secondary | ICD-10-CM | POA: Diagnosis not present

## 2020-01-28 DIAGNOSIS — M25561 Pain in right knee: Secondary | ICD-10-CM | POA: Diagnosis not present

## 2020-01-28 DIAGNOSIS — G894 Chronic pain syndrome: Secondary | ICD-10-CM | POA: Diagnosis not present

## 2020-01-28 DIAGNOSIS — M797 Fibromyalgia: Secondary | ICD-10-CM | POA: Diagnosis not present

## 2020-01-28 DIAGNOSIS — M792 Neuralgia and neuritis, unspecified: Secondary | ICD-10-CM | POA: Diagnosis not present

## 2020-01-28 DIAGNOSIS — Z5181 Encounter for therapeutic drug level monitoring: Secondary | ICD-10-CM | POA: Diagnosis not present

## 2020-02-11 DIAGNOSIS — Z1231 Encounter for screening mammogram for malignant neoplasm of breast: Secondary | ICD-10-CM | POA: Diagnosis not present

## 2020-02-24 DIAGNOSIS — M25561 Pain in right knee: Secondary | ICD-10-CM | POA: Diagnosis not present

## 2020-02-25 DIAGNOSIS — M25561 Pain in right knee: Secondary | ICD-10-CM | POA: Diagnosis not present

## 2020-02-25 DIAGNOSIS — M792 Neuralgia and neuritis, unspecified: Secondary | ICD-10-CM | POA: Diagnosis not present

## 2020-02-25 DIAGNOSIS — G894 Chronic pain syndrome: Secondary | ICD-10-CM | POA: Diagnosis not present

## 2020-02-25 DIAGNOSIS — M797 Fibromyalgia: Secondary | ICD-10-CM | POA: Diagnosis not present

## 2020-03-09 DIAGNOSIS — L298 Other pruritus: Secondary | ICD-10-CM | POA: Diagnosis not present

## 2020-03-09 DIAGNOSIS — H938X2 Other specified disorders of left ear: Secondary | ICD-10-CM | POA: Diagnosis not present

## 2020-03-09 DIAGNOSIS — R600 Localized edema: Secondary | ICD-10-CM | POA: Diagnosis not present

## 2020-04-01 DIAGNOSIS — F3341 Major depressive disorder, recurrent, in partial remission: Secondary | ICD-10-CM | POA: Diagnosis not present

## 2020-04-05 DIAGNOSIS — M791 Myalgia, unspecified site: Secondary | ICD-10-CM | POA: Diagnosis not present

## 2020-04-05 DIAGNOSIS — R0981 Nasal congestion: Secondary | ICD-10-CM | POA: Diagnosis not present

## 2020-04-05 DIAGNOSIS — R5383 Other fatigue: Secondary | ICD-10-CM | POA: Diagnosis not present

## 2020-04-05 DIAGNOSIS — R509 Fever, unspecified: Secondary | ICD-10-CM | POA: Diagnosis not present

## 2020-04-05 DIAGNOSIS — Z20822 Contact with and (suspected) exposure to covid-19: Secondary | ICD-10-CM | POA: Diagnosis not present

## 2020-04-05 DIAGNOSIS — R05 Cough: Secondary | ICD-10-CM | POA: Diagnosis not present

## 2020-04-11 ENCOUNTER — Other Ambulatory Visit: Payer: Self-pay | Admitting: Endocrinology

## 2020-04-11 DIAGNOSIS — E1169 Type 2 diabetes mellitus with other specified complication: Secondary | ICD-10-CM

## 2020-04-11 DIAGNOSIS — E669 Obesity, unspecified: Secondary | ICD-10-CM

## 2020-04-11 NOTE — Telephone Encounter (Signed)
1.  Please schedule f/u appt 2.  Then please refill x 2 mos, pending that appt.  

## 2020-05-03 ENCOUNTER — Other Ambulatory Visit: Payer: Self-pay | Admitting: Endocrinology

## 2020-05-06 ENCOUNTER — Other Ambulatory Visit: Payer: Self-pay

## 2020-05-06 ENCOUNTER — Ambulatory Visit: Payer: Medicare HMO

## 2020-05-13 ENCOUNTER — Other Ambulatory Visit: Payer: Self-pay | Admitting: Endocrinology

## 2020-05-16 DIAGNOSIS — Z20828 Contact with and (suspected) exposure to other viral communicable diseases: Secondary | ICD-10-CM | POA: Diagnosis not present

## 2020-05-21 ENCOUNTER — Encounter: Payer: Self-pay | Admitting: Endocrinology

## 2020-05-25 ENCOUNTER — Ambulatory Visit: Payer: Medicare HMO | Admitting: Endocrinology

## 2020-05-27 DIAGNOSIS — H2513 Age-related nuclear cataract, bilateral: Secondary | ICD-10-CM | POA: Diagnosis not present

## 2020-05-27 DIAGNOSIS — H401131 Primary open-angle glaucoma, bilateral, mild stage: Secondary | ICD-10-CM | POA: Diagnosis not present

## 2020-05-27 DIAGNOSIS — H40053 Ocular hypertension, bilateral: Secondary | ICD-10-CM | POA: Diagnosis not present

## 2020-05-27 DIAGNOSIS — H35033 Hypertensive retinopathy, bilateral: Secondary | ICD-10-CM | POA: Diagnosis not present

## 2020-06-01 DIAGNOSIS — H25013 Cortical age-related cataract, bilateral: Secondary | ICD-10-CM | POA: Diagnosis not present

## 2020-06-01 DIAGNOSIS — H2513 Age-related nuclear cataract, bilateral: Secondary | ICD-10-CM | POA: Diagnosis not present

## 2020-06-01 DIAGNOSIS — H2512 Age-related nuclear cataract, left eye: Secondary | ICD-10-CM | POA: Diagnosis not present

## 2020-06-01 DIAGNOSIS — H401131 Primary open-angle glaucoma, bilateral, mild stage: Secondary | ICD-10-CM | POA: Diagnosis not present

## 2020-06-01 DIAGNOSIS — H40053 Ocular hypertension, bilateral: Secondary | ICD-10-CM | POA: Diagnosis not present

## 2020-06-01 LAB — HM DIABETES EYE EXAM

## 2020-06-03 ENCOUNTER — Encounter: Payer: Self-pay | Admitting: Endocrinology

## 2020-06-09 DIAGNOSIS — H25812 Combined forms of age-related cataract, left eye: Secondary | ICD-10-CM | POA: Diagnosis not present

## 2020-06-09 DIAGNOSIS — H401121 Primary open-angle glaucoma, left eye, mild stage: Secondary | ICD-10-CM | POA: Diagnosis not present

## 2020-06-09 DIAGNOSIS — H2512 Age-related nuclear cataract, left eye: Secondary | ICD-10-CM | POA: Diagnosis not present

## 2020-06-09 DIAGNOSIS — H40052 Ocular hypertension, left eye: Secondary | ICD-10-CM | POA: Diagnosis not present

## 2020-06-10 DIAGNOSIS — F3341 Major depressive disorder, recurrent, in partial remission: Secondary | ICD-10-CM | POA: Diagnosis not present

## 2020-06-14 DIAGNOSIS — H2511 Age-related nuclear cataract, right eye: Secondary | ICD-10-CM | POA: Diagnosis not present

## 2020-06-14 DIAGNOSIS — H25011 Cortical age-related cataract, right eye: Secondary | ICD-10-CM | POA: Diagnosis not present

## 2020-06-18 ENCOUNTER — Other Ambulatory Visit: Payer: Self-pay

## 2020-06-18 ENCOUNTER — Encounter: Payer: Self-pay | Admitting: Endocrinology

## 2020-06-18 ENCOUNTER — Ambulatory Visit: Payer: Medicare HMO | Admitting: Endocrinology

## 2020-06-18 VITALS — BP 144/80 | HR 80 | Ht 62.0 in | Wt 177.0 lb

## 2020-06-18 DIAGNOSIS — E669 Obesity, unspecified: Secondary | ICD-10-CM

## 2020-06-18 DIAGNOSIS — E1169 Type 2 diabetes mellitus with other specified complication: Secondary | ICD-10-CM | POA: Diagnosis not present

## 2020-06-18 LAB — T4, FREE: Free T4: 0.68 ng/dL (ref 0.60–1.60)

## 2020-06-18 LAB — POCT GLYCOSYLATED HEMOGLOBIN (HGB A1C): Hemoglobin A1C: 7.2 % — AB (ref 4.0–5.6)

## 2020-06-18 LAB — LIPID PANEL
Cholesterol: 259 mg/dL — ABNORMAL HIGH (ref 0–200)
HDL: 54.8 mg/dL (ref >39.00)
NonHDL: 204.47
Total CHOL/HDL Ratio: 5
Triglycerides: 246 mg/dL — ABNORMAL HIGH (ref 0.0–149.0)
VLDL: 49.2 mg/dL — ABNORMAL HIGH (ref 0.0–40.0)

## 2020-06-18 LAB — BASIC METABOLIC PANEL
BUN: 13 mg/dL (ref 6–23)
CO2: 26 mEq/L (ref 19–32)
Calcium: 10 mg/dL (ref 8.4–10.5)
Chloride: 103 mEq/L (ref 96–112)
Creatinine, Ser: 0.69 mg/dL (ref 0.40–1.20)
GFR: 92.13 mL/min (ref >60.00)
Glucose, Bld: 82 mg/dL (ref 70–99)
Potassium: 3.8 mEq/L (ref 3.5–5.1)
Sodium: 137 mEq/L (ref 135–145)

## 2020-06-18 LAB — TSH: TSH: 1.72 u[IU]/mL (ref 0.35–4.50)

## 2020-06-18 LAB — LDL CHOLESTEROL, DIRECT: Direct LDL: 182 mg/dL

## 2020-06-18 MED ORDER — METFORMIN HCL ER 500 MG PO TB24: 500.0000 mg | ORAL_TABLET | Freq: Every day | ORAL | 3 refills | Status: DC

## 2020-06-18 MED ORDER — BROMOCRIPTINE MESYLATE 2.5 MG PO TABS: 2.5000 mg | ORAL_TABLET | Freq: Every day | ORAL | 3 refills | Status: DC

## 2020-06-18 MED ORDER — ACCU-CHEK GUIDE VI STRP
1.0000 | ORAL_STRIP | Freq: Every day | 3 refills | Status: DC
Start: 2020-06-18 — End: 2021-06-20

## 2020-06-18 MED ORDER — PIOGLITAZONE HCL 15 MG PO TABS: 15.0000 mg | ORAL_TABLET | Freq: Every day | ORAL | 3 refills | Status: DC

## 2020-06-18 MED ORDER — ACCU-CHEK GUIDE VI STRP
1.0000 | ORAL_STRIP | Freq: Every day | 11 refills | Status: DC
Start: 2020-06-18 — End: 2020-06-18

## 2020-06-18 NOTE — Patient Instructions (Addendum)
check your blood sugar once a day.  vary the time of day when you check, between before the 3 meals, and at bedtime.  also check if you have symptoms of your blood sugar being too high or too low.  please keep a record of the readings and bring it to your next appointment here (or you can bring the meter itself).  You can write it on any piece of paper.  please call us sooner if your blood sugar goes below 70, or if you have a lot of readings over 200.   I have sent a prescription to your pharmacy, to increase the bromocriptine.   Please continue the same other medications.   Your blood pressure is high today.  Please see your primary care provider soon, to have it rechecked Please come back for a follow-up appointment in 3 months.

## 2020-06-18 NOTE — Progress Notes (Signed)
Subjective:    Patient ID: Terri Daniel, female    DOB: 04-15-1956, 64 y.o.   MRN: [id number]  HPI Pt returns for f/u of diabetes mellitus: DM type: 2 Dx'ed: 2013 Complications: none.   Therapy: 3 oral meds GDM: never DKA: never Severe hypoglycemia: never Pancreatitis: never Pancreatic imaging: never Other: she has never taken insulin, but she has learned how; metformin-XR dosage is limited by diarrhea. She says she cannot afford brand-name medications.   Interval history: she brings her meter with her fasting cbg's which I have reviewed today.  cbg varies from 113-142.  She takes no steroids except for eye drops.     Past Medical History:  Diagnosis Date  . Anxiety   . Arthritis   . Asthma   . Bulging lumbar disc   . Chronic interstitial cystitis   . Depression   . Diabetes mellitus without complication (HCC)   . Disorder of left rotator cuff   . Fibromyalgia   . GERD (gastroesophageal reflux disease)   . HLD (hyperlipidemia)   . Hypertension   . Tobacco abuse   . Transaminitis     Past Surgical History:  Procedure Laterality Date  . CYSTOSCOPY    . JOINT REPLACEMENT Left 2009   Knee  . LEFT HEART CATH AND CORONARY ANGIOGRAPHY N/A 02/07/2017   Procedure: Left Heart Cath and Coronary Angiography;  Surgeon: Lennette Bihari, MD;  Location: [facility] INVASIVE CV LAB;  Service: Cardiovascular;  Laterality: N/A;    Social History   Socioeconomic History  . Marital status: Divorced    Spouse name: Not on file  . Number of children: 2  . Years of education: 46  . Highest education level: Not on file  Occupational History  . Occupation: disabled  Tobacco Use  . Smoking status: Current Every Day Smoker    Packs/day: 0.50    Types: Cigarettes  . Smokeless tobacco: Never Used  . Tobacco comment: 27 years total as of 2018  Vaping Use  . Vaping Use: Never used  Substance and Sexual Activity  . Alcohol use: No  . Drug use: No  . Sexual activity: Not on file  Other  Topics Concern  . Not on file  Social History Narrative   Lives alone in a one story home.  Has 2 children.  On disability due to interstitial cystitis.  Education: college.    Social Determinants of Health   Financial Resource Strain:   . Difficulty of Paying Living Expenses: Not on file  Food Insecurity:   . Worried About Programme researcher, broadcasting/film/video in the Last Year: Not on file  . Ran Out of Food in the Last Year: Not on file  Transportation Needs:   . Lack of Transportation (Medical): Not on file  . Lack of Transportation (Non-Medical): Not on file  Physical Activity:   . Days of Exercise per Week: Not on file  . Minutes of Exercise per Session: Not on file  Stress:   . Feeling of Stress : Not on file  Social Connections:   . Frequency of Communication with Friends and Family: Not on file  . Frequency of Social Gatherings with Friends and Family: Not on file  . Attends Religious Services: Not on file  . Active Member of Clubs or Organizations: Not on file  . Attends Banker Meetings: Not on file  . Marital Status: Not on file  Intimate Partner Violence:   . Fear of Current or Ex-Partner: Not  on file  . Emotionally Abused: Not on file  . Physically Abused: Not on file  . Sexually Abused: Not on file    Current Outpatient Medications on File Prior to Visit  Medication Sig Dispense Refill  . albuterol (PROVENTIL HFA;VENTOLIN HFA) 108 (90 Base) MCG/ACT inhaler Inhale into the lungs every 6 (six) hours as needed for wheezing or shortness of breath.    Marland Kitchen buPROPion (WELLBUTRIN XL) 150 MG 24 hr tablet Take 150 mg by mouth daily.    . diclofenac sodium (VOLTAREN) 1 % GEL Place 1 application onto the skin 2 (two) times daily as needed.    . ezetimibe (ZETIA) 10 MG tablet Take 10 mg by mouth daily.    . fluticasone (FLONASE) 50 MCG/ACT nasal spray Place into both nostrils daily.    . hydrOXYzine (ATARAX/VISTARIL) 25 MG tablet Take 25 mg once as needed by mouth.    Marland Kitchen  lisinopril-hydrochlorothiazide (PRINZIDE,ZESTORETIC) 20-12.5 MG tablet Take 2 tablets by mouth daily.    Marland Kitchen omeprazole (PRILOSEC) 40 MG capsule     . rosuvastatin (CRESTOR) 40 MG tablet Take 40 mg by mouth daily.    . valACYclovir (VALTREX) 1000 MG tablet Take 1,000 mg by mouth 2 (two) times daily.    . cetirizine (ZYRTEC) 10 MG tablet Take 10 mg by mouth daily.    . Nutritional Supplements (NUTRITIONAL SUPPLEMENT PO) Take 1,250 mg by mouth.     No current facility-administered medications on file prior to visit.    Allergies  Allergen Reactions  . Other Shortness Of Breath  . Propoxyphene Shortness Of Breath  . Doxycycline Nausea And Vomiting  . Erythromycin Base Rash  . Sulfa Antibiotics Rash  . Contrast Media [Iodinated Diagnostic Agents] Hives    CT in 2004  . Prednisone     Heart pounding, rash     Family History  Problem Relation Age of Onset  . CAD Mother        stent in her 3s  . Atrial fibrillation Mother   . CAD Father        CABG age 47  . Diabetes Father   . CAD Brother        "widow maker" MI before age 52 requiring stent  . Diabetes Mellitus II Brother     BP (!) 144/80   Pulse 80   Ht 5\' 2"  (1.575 m)   Wt 177 lb (80.3 kg)   SpO2 96%   BMI 32.37 kg/m    Review of Systems Denies nausea    Objective:   Physical Exam VITAL SIGNS:  See vs page GENERAL: no distress Pulses: dorsalis pedis intact bilat.   MSK: no deformity of the feet CV: no leg edema Skin:  no ulcer on the feet.  normal color and temp on the feet. Neuro: sensation is intact to touch on the feet  Lab Results  Component Value Date   HGBA1C 7.2 (A) 06/18/2020       Assessment & Plan:  Type 2 DM: uncertain etiology and prognosis HTN: is noted today  Patient Instructions  check your blood sugar once a day.  vary the time of day when you check, between before the 3 meals, and at bedtime.  also check if you have symptoms of your blood sugar being too high or too low.  please keep  a record of the readings and bring it to your next appointment here (or you can bring the meter itself).  You can write it on any  piece of paper.  please call us sooner if your blood sugar goes below 70, or if you have a lot of readings over 200.   I have sent a prescription to your pharmacy, to increase the bromocriptine.   Please continue the same other medications.   Your blood pressure is high today.  Please see your primary care provider soon, to have it rechecked Please come back for a follow-up appointment in 3 months.

## 2020-06-24 ENCOUNTER — Ambulatory Visit: Payer: Medicare HMO | Admitting: Endocrinology

## 2020-07-20 DIAGNOSIS — F3341 Major depressive disorder, recurrent, in partial remission: Secondary | ICD-10-CM | POA: Diagnosis not present

## 2020-07-21 DIAGNOSIS — H25011 Cortical age-related cataract, right eye: Secondary | ICD-10-CM | POA: Diagnosis not present

## 2020-07-21 DIAGNOSIS — H401111 Primary open-angle glaucoma, right eye, mild stage: Secondary | ICD-10-CM | POA: Diagnosis not present

## 2020-07-21 DIAGNOSIS — H2511 Age-related nuclear cataract, right eye: Secondary | ICD-10-CM | POA: Diagnosis not present

## 2020-07-21 DIAGNOSIS — H401131 Primary open-angle glaucoma, bilateral, mild stage: Secondary | ICD-10-CM | POA: Diagnosis not present

## 2020-08-24 DIAGNOSIS — Z20822 Contact with and (suspected) exposure to covid-19: Secondary | ICD-10-CM | POA: Diagnosis not present

## 2020-08-24 DIAGNOSIS — R5383 Other fatigue: Secondary | ICD-10-CM | POA: Diagnosis not present

## 2020-09-15 DIAGNOSIS — F3341 Major depressive disorder, recurrent, in partial remission: Secondary | ICD-10-CM | POA: Diagnosis not present

## 2020-09-21 ENCOUNTER — Encounter: Payer: Self-pay | Admitting: Endocrinology

## 2020-09-22 ENCOUNTER — Telehealth (INDEPENDENT_AMBULATORY_CARE_PROVIDER_SITE_OTHER): Payer: Medicare HMO | Admitting: Endocrinology

## 2020-09-22 ENCOUNTER — Other Ambulatory Visit: Payer: Self-pay

## 2020-09-22 DIAGNOSIS — E1169 Type 2 diabetes mellitus with other specified complication: Secondary | ICD-10-CM

## 2020-09-22 DIAGNOSIS — E669 Obesity, unspecified: Secondary | ICD-10-CM | POA: Diagnosis not present

## 2020-09-22 MED ORDER — COLESEVELAM HCL 625 MG PO TABS: 1250.0000 mg | ORAL_TABLET | Freq: Every day | ORAL | 11 refills | Status: DC

## 2020-09-22 NOTE — Progress Notes (Signed)
Subjective:    Patient ID: Terri Daniel, female    DOB: [birth year], 65 y.o.   MRN: [id number]  HPI  telehealth visit today via video visit.  Alternatives to telehealth are presented to this patient, and the patient agrees to the telehealth visit. Pt is advised of the cost of the visit, and agrees to this, also.   Patient is at home, and I am at the office.   Persons attending the telehealth visit: the patient and I.   Pt returns for f/u of diabetes mellitus: DM type: 2 Dx'ed: 2013 Complications: none.   Therapy: 3 oral meds GDM: never DKA: never Severe hypoglycemia: never Pancreatitis: never Pancreatic imaging: never Other: she has never taken insulin, but she has learned how; metformin-XR dosage is limited by diarrhea. She says she cannot afford brand-name medications.   Interval history: she says cbg varies from 112-157, with average of 147.  pt states she feels well in general, except for chronic myalgias.   Past Medical History:  Diagnosis Date  . Anxiety   . Arthritis   . Asthma   . Bulging lumbar disc   . Chronic interstitial cystitis   . Depression   . Diabetes mellitus without complication (HCC)   . Disorder of left rotator cuff   . Fibromyalgia   . GERD (gastroesophageal reflux disease)   . HLD (hyperlipidemia)   . Hypertension   . Tobacco abuse   . Transaminitis     Past Surgical History:  Procedure Laterality Date  . CYSTOSCOPY    . JOINT REPLACEMENT Left 2009   Knee  . LEFT HEART CATH AND CORONARY ANGIOGRAPHY N/A 02/07/2017   Procedure: Left Heart Cath and Coronary Angiography;  Surgeon: Lennette Bihari, MD;  Location: [facility] INVASIVE CV LAB;  Service: Cardiovascular;  Laterality: N/A;    Social History   Socioeconomic History  . Marital status: Divorced    Spouse name: Not on file  . Number of children: 2  . Years of education: 54  . Highest education level: Not on file  Occupational History  . Occupation: disabled  Tobacco Use  . Smoking  status: Current Every Day Smoker    Packs/day: 0.50    Types: Cigarettes  . Smokeless tobacco: Never Used  . Tobacco comment: 27 years total as of 2018  Vaping Use  . Vaping Use: Never used  Substance and Sexual Activity  . Alcohol use: No  . Drug use: No  . Sexual activity: Not on file  Other Topics Concern  . Not on file  Social History Narrative   Lives alone in a one story home.  Has 2 children.  On disability due to interstitial cystitis.  Education: college.    Social Determinants of Health   Financial Resource Strain: Not on file  Food Insecurity: Not on file  Transportation Needs: Not on file  Physical Activity: Not on file  Stress: Not on file  Social Connections: Not on file  Intimate Partner Violence: Not on file    Current Outpatient Medications on File Prior to Visit  Medication Sig Dispense Refill  . albuterol (PROVENTIL HFA;VENTOLIN HFA) 108 (90 Base) MCG/ACT inhaler Inhale into the lungs every 6 (six) hours as needed for wheezing or shortness of breath.    . bromocriptine (PARLODEL) 2.5 MG tablet Take 1 tablet (2.5 mg total) by mouth daily. 45 tablet 3  . buPROPion (WELLBUTRIN XL) 150 MG 24 hr tablet Take 150 mg by mouth daily.    Marland Kitchen  cetirizine (ZYRTEC) 10 MG tablet Take 10 mg by mouth daily.    . diclofenac sodium (VOLTAREN) 1 % GEL Place 1 application onto the skin 2 (two) times daily as needed.    . ezetimibe (ZETIA) 10 MG tablet Take 10 mg by mouth daily.    . fluticasone (FLONASE) 50 MCG/ACT nasal spray Place into both nostrils daily.    Marland Kitchen glucose blood (ACCU-CHEK GUIDE) test strip 1 each by Other route daily. And lancets 1/day 100 strip 3  . hydrOXYzine (ATARAX/VISTARIL) 25 MG tablet Take 25 mg once as needed by mouth.    Marland Kitchen lisinopril-hydrochlorothiazide (PRINZIDE,ZESTORETIC) 20-12.5 MG tablet Take 2 tablets by mouth daily.    . metFORMIN (GLUCOPHAGE-XR) 500 MG 24 hr tablet Take 1 tablet (500 mg total) by mouth daily with breakfast. 90 tablet 3  .  Nutritional Supplements (NUTRITIONAL SUPPLEMENT PO) Take 1,250 mg by mouth.    Marland Kitchen omeprazole (PRILOSEC) 40 MG capsule     . pioglitazone (ACTOS) 15 MG tablet Take 1 tablet (15 mg total) by mouth daily. 90 tablet 3  . rosuvastatin (CRESTOR) 40 MG tablet Take 40 mg by mouth daily.    . valACYclovir (VALTREX) 1000 MG tablet Take 1,000 mg by mouth 2 (two) times daily.     No current facility-administered medications on file prior to visit.    Allergies  Allergen Reactions  . Other Shortness Of Breath  . Propoxyphene Shortness Of Breath  . Doxycycline Nausea And Vomiting  . Erythromycin Base Rash  . Sulfa Antibiotics Rash  . Contrast Media [Iodinated Diagnostic Agents] Hives    CT in 2004  . Prednisone     Heart pounding, rash     Family History  Problem Relation Age of Onset  . CAD Mother        stent in her 42s  . Atrial fibrillation Mother   . CAD Father        CABG age 88  . Diabetes Father   . CAD Brother        "widow maker" MI before age 31 requiring stent  . Diabetes Mellitus II Brother     There were no vitals taken for this visit.   Review of Systems She denies hypoglycemia and ankle swelling.      Objective:   Physical Exam       Assessment & Plan:  Type 2 DM: uncontrolled.    Patient Instructions  check your blood sugar once a day.  vary the time of day when you check, between before the 3 meals, and at bedtime.  also check if you have symptoms of your blood sugar being too high or too low.  please keep a record of the readings and bring it to your next appointment here (or you can bring the meter itself).  You can write it on any piece of paper.  please call us sooner if your blood sugar goes below 70, or if you have a lot of readings over 200.   I have sent a prescription to your pharmacy, to add colesevelam.   Please continue the same other medications.   Please come back for a follow-up appointment in 2 months.

## 2020-09-22 NOTE — Patient Instructions (Addendum)
check your blood sugar once a day.  vary the time of day when you check, between before the 3 meals, and at bedtime.  also check if you have symptoms of your blood sugar being too high or too low.  please keep a record of the readings and bring it to your next appointment here (or you can bring the meter itself).  You can write it on any piece of paper.  please call us sooner if your blood sugar goes below 70, or if you have a lot of readings over 200.   I have sent a prescription to your pharmacy, to add colesevelam.   Please continue the same other medications.   Please come back for a follow-up appointment in 2 months.

## 2020-09-27 DIAGNOSIS — G894 Chronic pain syndrome: Secondary | ICD-10-CM | POA: Diagnosis not present

## 2020-09-27 DIAGNOSIS — Z79899 Other long term (current) drug therapy: Secondary | ICD-10-CM | POA: Diagnosis not present

## 2020-09-27 DIAGNOSIS — M792 Neuralgia and neuritis, unspecified: Secondary | ICD-10-CM | POA: Diagnosis not present

## 2020-09-27 DIAGNOSIS — Z5181 Encounter for therapeutic drug level monitoring: Secondary | ICD-10-CM | POA: Diagnosis not present

## 2020-09-27 DIAGNOSIS — M25561 Pain in right knee: Secondary | ICD-10-CM | POA: Diagnosis not present

## 2020-09-27 DIAGNOSIS — G8929 Other chronic pain: Secondary | ICD-10-CM | POA: Diagnosis not present

## 2020-09-27 DIAGNOSIS — M797 Fibromyalgia: Secondary | ICD-10-CM | POA: Diagnosis not present

## 2020-09-29 DIAGNOSIS — Z20822 Contact with and (suspected) exposure to covid-19: Secondary | ICD-10-CM | POA: Diagnosis not present

## 2020-09-29 DIAGNOSIS — J019 Acute sinusitis, unspecified: Secondary | ICD-10-CM | POA: Diagnosis not present

## 2020-10-18 DIAGNOSIS — L821 Other seborrheic keratosis: Secondary | ICD-10-CM | POA: Diagnosis not present

## 2020-10-18 DIAGNOSIS — L738 Other specified follicular disorders: Secondary | ICD-10-CM | POA: Diagnosis not present

## 2020-10-18 DIAGNOSIS — L820 Inflamed seborrheic keratosis: Secondary | ICD-10-CM | POA: Diagnosis not present

## 2020-11-04 DIAGNOSIS — M25561 Pain in right knee: Secondary | ICD-10-CM | POA: Diagnosis not present

## 2020-11-04 DIAGNOSIS — M25562 Pain in left knee: Secondary | ICD-10-CM | POA: Diagnosis not present

## 2020-11-04 DIAGNOSIS — Z96652 Presence of left artificial knee joint: Secondary | ICD-10-CM | POA: Diagnosis not present

## 2020-11-04 DIAGNOSIS — M1711 Unilateral primary osteoarthritis, right knee: Secondary | ICD-10-CM | POA: Diagnosis not present

## 2020-11-08 ENCOUNTER — Telehealth: Payer: Self-pay | Admitting: Endocrinology

## 2020-11-08 NOTE — Telephone Encounter (Signed)
Patient called stating her pharmacy sent over a fax for a PA for her welchol after it was called in in Jan - I told her from what I could see, we had not gotten anything for that yet so she said she's asked them to resend that over. Just giving a heads up this should be coming through.

## 2020-11-12 DIAGNOSIS — M1711 Unilateral primary osteoarthritis, right knee: Secondary | ICD-10-CM | POA: Diagnosis not present

## 2020-11-15 ENCOUNTER — Telehealth: Payer: Self-pay

## 2020-11-15 NOTE — Telephone Encounter (Signed)
Spoke with pt to let her know that her insurance denied the Colesevelam 625 tabs. Ask that she contact her insurance company to discuss an alternative to the same medication to see what they will be willing to cover and the [place] know at the office.

## 2020-12-27 DIAGNOSIS — H04123 Dry eye syndrome of bilateral lacrimal glands: Secondary | ICD-10-CM | POA: Diagnosis not present

## 2020-12-27 DIAGNOSIS — H35033 Hypertensive retinopathy, bilateral: Secondary | ICD-10-CM | POA: Diagnosis not present

## 2020-12-27 DIAGNOSIS — H35361 Drusen (degenerative) of macula, right eye: Secondary | ICD-10-CM | POA: Diagnosis not present

## 2020-12-27 DIAGNOSIS — H524 Presbyopia: Secondary | ICD-10-CM | POA: Diagnosis not present

## 2020-12-27 DIAGNOSIS — Z961 Presence of intraocular lens: Secondary | ICD-10-CM | POA: Diagnosis not present

## 2021-01-04 DIAGNOSIS — M25561 Pain in right knee: Secondary | ICD-10-CM | POA: Diagnosis not present

## 2021-01-04 DIAGNOSIS — Z96652 Presence of left artificial knee joint: Secondary | ICD-10-CM | POA: Diagnosis not present

## 2021-01-04 DIAGNOSIS — G8929 Other chronic pain: Secondary | ICD-10-CM | POA: Diagnosis not present

## 2021-01-04 DIAGNOSIS — M25562 Pain in left knee: Secondary | ICD-10-CM | POA: Diagnosis not present

## 2021-01-04 DIAGNOSIS — M1711 Unilateral primary osteoarthritis, right knee: Secondary | ICD-10-CM | POA: Diagnosis not present

## 2021-01-04 DIAGNOSIS — M792 Neuralgia and neuritis, unspecified: Secondary | ICD-10-CM | POA: Diagnosis not present

## 2021-01-13 DIAGNOSIS — F3341 Major depressive disorder, recurrent, in partial remission: Secondary | ICD-10-CM | POA: Diagnosis not present

## 2021-01-15 DIAGNOSIS — H0288B Meibomian gland dysfunction left eye, upper and lower eyelids: Secondary | ICD-10-CM | POA: Diagnosis not present

## 2021-01-15 DIAGNOSIS — H0288A Meibomian gland dysfunction right eye, upper and lower eyelids: Secondary | ICD-10-CM | POA: Diagnosis not present

## 2021-01-15 DIAGNOSIS — H04123 Dry eye syndrome of bilateral lacrimal glands: Secondary | ICD-10-CM | POA: Diagnosis not present

## 2021-01-15 DIAGNOSIS — H401134 Primary open-angle glaucoma, bilateral, indeterminate stage: Secondary | ICD-10-CM | POA: Diagnosis not present

## 2021-02-02 DIAGNOSIS — H814 Vertigo of central origin: Secondary | ICD-10-CM | POA: Diagnosis not present

## 2021-02-02 DIAGNOSIS — M9902 Segmental and somatic dysfunction of thoracic region: Secondary | ICD-10-CM | POA: Diagnosis not present

## 2021-02-02 DIAGNOSIS — M47892 Other spondylosis, cervical region: Secondary | ICD-10-CM | POA: Diagnosis not present

## 2021-02-02 DIAGNOSIS — M9901 Segmental and somatic dysfunction of cervical region: Secondary | ICD-10-CM | POA: Diagnosis not present

## 2021-02-03 DIAGNOSIS — H814 Vertigo of central origin: Secondary | ICD-10-CM | POA: Diagnosis not present

## 2021-02-03 DIAGNOSIS — M47892 Other spondylosis, cervical region: Secondary | ICD-10-CM | POA: Diagnosis not present

## 2021-02-03 DIAGNOSIS — M9902 Segmental and somatic dysfunction of thoracic region: Secondary | ICD-10-CM | POA: Diagnosis not present

## 2021-02-03 DIAGNOSIS — M9901 Segmental and somatic dysfunction of cervical region: Secondary | ICD-10-CM | POA: Diagnosis not present

## 2021-02-08 DIAGNOSIS — M9901 Segmental and somatic dysfunction of cervical region: Secondary | ICD-10-CM | POA: Diagnosis not present

## 2021-02-08 DIAGNOSIS — M47892 Other spondylosis, cervical region: Secondary | ICD-10-CM | POA: Diagnosis not present

## 2021-02-08 DIAGNOSIS — M9902 Segmental and somatic dysfunction of thoracic region: Secondary | ICD-10-CM | POA: Diagnosis not present

## 2021-02-08 DIAGNOSIS — H814 Vertigo of central origin: Secondary | ICD-10-CM | POA: Diagnosis not present

## 2021-03-04 DIAGNOSIS — H814 Vertigo of central origin: Secondary | ICD-10-CM | POA: Diagnosis not present

## 2021-03-04 DIAGNOSIS — M9901 Segmental and somatic dysfunction of cervical region: Secondary | ICD-10-CM | POA: Diagnosis not present

## 2021-03-04 DIAGNOSIS — M9902 Segmental and somatic dysfunction of thoracic region: Secondary | ICD-10-CM | POA: Diagnosis not present

## 2021-03-04 DIAGNOSIS — M47892 Other spondylosis, cervical region: Secondary | ICD-10-CM | POA: Diagnosis not present

## 2021-03-11 DIAGNOSIS — M1711 Unilateral primary osteoarthritis, right knee: Secondary | ICD-10-CM | POA: Diagnosis not present

## 2021-03-19 DIAGNOSIS — R5383 Other fatigue: Secondary | ICD-10-CM | POA: Diagnosis not present

## 2021-03-19 DIAGNOSIS — I10 Essential (primary) hypertension: Secondary | ICD-10-CM | POA: Diagnosis not present

## 2021-03-19 DIAGNOSIS — E669 Obesity, unspecified: Secondary | ICD-10-CM | POA: Diagnosis not present

## 2021-03-19 DIAGNOSIS — U071 COVID-19: Secondary | ICD-10-CM | POA: Diagnosis not present

## 2021-03-19 DIAGNOSIS — E119 Type 2 diabetes mellitus without complications: Secondary | ICD-10-CM | POA: Diagnosis not present

## 2021-03-19 DIAGNOSIS — J45909 Unspecified asthma, uncomplicated: Secondary | ICD-10-CM | POA: Diagnosis not present

## 2021-04-20 DIAGNOSIS — H0288A Meibomian gland dysfunction right eye, upper and lower eyelids: Secondary | ICD-10-CM | POA: Diagnosis not present

## 2021-04-20 DIAGNOSIS — H0288B Meibomian gland dysfunction left eye, upper and lower eyelids: Secondary | ICD-10-CM | POA: Diagnosis not present

## 2021-04-20 DIAGNOSIS — H401134 Primary open-angle glaucoma, bilateral, indeterminate stage: Secondary | ICD-10-CM | POA: Diagnosis not present

## 2021-04-20 DIAGNOSIS — H16223 Keratoconjunctivitis sicca, not specified as Sjogren's, bilateral: Secondary | ICD-10-CM | POA: Diagnosis not present

## 2021-04-21 DIAGNOSIS — J449 Chronic obstructive pulmonary disease, unspecified: Secondary | ICD-10-CM | POA: Diagnosis not present

## 2021-04-21 DIAGNOSIS — N3010 Interstitial cystitis (chronic) without hematuria: Secondary | ICD-10-CM | POA: Diagnosis not present

## 2021-04-21 DIAGNOSIS — N9489 Other specified conditions associated with female genital organs and menstrual cycle: Secondary | ICD-10-CM | POA: Diagnosis not present

## 2021-04-26 DIAGNOSIS — Z1159 Encounter for screening for other viral diseases: Secondary | ICD-10-CM | POA: Diagnosis not present

## 2021-04-26 DIAGNOSIS — K2100 Gastro-esophageal reflux disease with esophagitis, without bleeding: Secondary | ICD-10-CM | POA: Diagnosis not present

## 2021-04-26 DIAGNOSIS — R1084 Generalized abdominal pain: Secondary | ICD-10-CM | POA: Diagnosis not present

## 2021-05-03 DIAGNOSIS — Z1159 Encounter for screening for other viral diseases: Secondary | ICD-10-CM | POA: Diagnosis not present

## 2021-05-03 DIAGNOSIS — M1711 Unilateral primary osteoarthritis, right knee: Secondary | ICD-10-CM | POA: Diagnosis not present

## 2021-05-03 DIAGNOSIS — R1084 Generalized abdominal pain: Secondary | ICD-10-CM | POA: Diagnosis not present

## 2021-05-05 ENCOUNTER — Ambulatory Visit (INDEPENDENT_AMBULATORY_CARE_PROVIDER_SITE_OTHER): Payer: Medicare HMO | Admitting: Endocrinology

## 2021-05-05 ENCOUNTER — Other Ambulatory Visit: Payer: Self-pay

## 2021-05-05 VITALS — BP 132/74 | HR 83 | Ht 62.0 in | Wt 175.0 lb

## 2021-05-05 DIAGNOSIS — E1169 Type 2 diabetes mellitus with other specified complication: Secondary | ICD-10-CM | POA: Diagnosis not present

## 2021-05-05 DIAGNOSIS — E669 Obesity, unspecified: Secondary | ICD-10-CM

## 2021-05-05 LAB — POCT GLYCOSYLATED HEMOGLOBIN (HGB A1C): Hemoglobin A1C: 7.1 % — AB (ref 4.0–5.6)

## 2021-05-05 MED ORDER — BROMOCRIPTINE MESYLATE 2.5 MG PO TABS: 2.5000 mg | ORAL_TABLET | Freq: Every day | ORAL | 3 refills | Status: DC

## 2021-05-05 MED ORDER — PIOGLITAZONE HCL 15 MG PO TABS: 15.0000 mg | ORAL_TABLET | Freq: Every day | ORAL | 3 refills | Status: DC

## 2021-05-05 MED ORDER — METFORMIN HCL ER 500 MG PO TB24: 500.0000 mg | ORAL_TABLET | Freq: Every day | ORAL | 3 refills | Status: DC

## 2021-05-05 NOTE — Progress Notes (Signed)
Subjective:    Patient ID: Terri Daniel, female    DOB: Mar 31, 1956, 65 y.o.   MRN: [id number]  HPI Pt returns for f/u of diabetes mellitus: DM type: 2 Dx'ed: 2013 Complications: none.   Therapy: 3 oral meds GDM: never DKA: never Severe hypoglycemia: never Pancreatitis: never Pancreatic imaging: never Other: she has never taken insulin, but she has learned how; metformin-XR dosage is limited by diarrhea. She says she cannot afford brand-name medications.   Interval history: She has steroid inject 2 mos ago.  She then had COVID infection.  Both of these increased cbg's.  They have improved again just in the past 2 weeks.  Meter is downloaded today, and the printout is scanned into the record.  Cbg's are all approx 100.  All are checked 5AM-11AM.  Ins declined colesevelam.  She has been off bromocriptine x 1 month Past Medical History:  Diagnosis Date   Anxiety    Arthritis    Asthma    Bulging lumbar disc    Chronic interstitial cystitis    Depression    Diabetes mellitus without complication (HCC)    Disorder of left rotator cuff    Fibromyalgia    GERD (gastroesophageal reflux disease)    HLD (hyperlipidemia)    Hypertension    Tobacco abuse    Transaminitis     Past Surgical History:  Procedure Laterality Date   CYSTOSCOPY     JOINT REPLACEMENT Left 2009   Knee   LEFT HEART CATH AND CORONARY ANGIOGRAPHY N/A 02/07/2017   Procedure: Left Heart Cath and Coronary Angiography;  Surgeon: Lennette Bihari, MD;  Location: [facility] INVASIVE CV LAB;  Service: Cardiovascular;  Laterality: N/A;    Social History   Socioeconomic History   Marital status: Divorced    Spouse name: Not on file   Number of children: 2   Years of education: 16   Highest education level: Not on file  Occupational History   Occupation: disabled  Tobacco Use   Smoking status: Every Day    Packs/day: 0.50    Types: Cigarettes   Smokeless tobacco: Never   Tobacco comments:    27 years total as of  2018  Vaping Use   Vaping Use: Never used  Substance and Sexual Activity   Alcohol use: No   Drug use: No   Sexual activity: Not on file  Other Topics Concern   Not on file  Social History Narrative   Lives alone in a one story home.  Has 2 children.  On disability due to interstitial cystitis.  Education: college.    Social Determinants of Health   Financial Resource Strain: Not on file  Food Insecurity: Not on file  Transportation Needs: Not on file  Physical Activity: Not on file  Stress: Not on file  Social Connections: Not on file  Intimate Partner Violence: Not on file    Current Outpatient Medications on File Prior to Visit  Medication Sig Dispense Refill   albuterol (PROVENTIL HFA;VENTOLIN HFA) 108 (90 Base) MCG/ACT inhaler Inhale into the lungs every 6 (six) hours as needed for wheezing or shortness of breath.     buPROPion (WELLBUTRIN XL) 150 MG 24 hr tablet Take 150 mg by mouth daily.     diclofenac sodium (VOLTAREN) 1 % GEL Place 1 application onto the skin 2 (two) times daily as needed.     ezetimibe (ZETIA) 10 MG tablet Take 10 mg by mouth daily.     fluticasone (FLONASE)  50 MCG/ACT nasal spray Place into both nostrils daily.     glucose blood (ACCU-CHEK GUIDE) test strip 1 each by Other route daily. And lancets 1/day 100 strip 3   hydrOXYzine (ATARAX/VISTARIL) 25 MG tablet Take 25 mg once as needed by mouth.     lisinopril-hydrochlorothiazide (PRINZIDE,ZESTORETIC) 20-12.5 MG tablet Take 2 tablets by mouth daily.     omeprazole (PRILOSEC) 40 MG capsule      rosuvastatin (CRESTOR) 40 MG tablet Take 40 mg by mouth daily.     valACYclovir (VALTREX) 1000 MG tablet Take 1,000 mg by mouth 2 (two) times daily.     cetirizine (ZYRTEC) 10 MG tablet Take 10 mg by mouth daily.     Nutritional Supplements (NUTRITIONAL SUPPLEMENT PO) Take 1,250 mg by mouth.     No current facility-administered medications on file prior to visit.    Allergies  Allergen Reactions   Other  Shortness Of Breath   Propoxyphene Shortness Of Breath   Doxycycline Nausea And Vomiting   Erythromycin Base Rash   Sulfa Antibiotics Rash   Contrast Media [Iodinated Diagnostic Agents] Hives    CT in 2004   Prednisone     Heart pounding, rash     Family History  Problem Relation Age of Onset   CAD Mother        stent in her 46s   Atrial fibrillation Mother    CAD Father        CABG age 5   Diabetes Father    CAD Brother        "widow maker" MI before age 98 requiring stent   Diabetes Mellitus II Brother     BP 132/74 (BP Location: Right Arm, Patient Position: Sitting, Cuff Size: Large)   Pulse 83   Ht 5\' 2"  (1.575 m)   Wt 175 lb (79.4 kg)   SpO2 97%   BMI 32.01 kg/m   Review of Systems     Objective:   Physical Exam Pulses: dorsalis pedis intact bilat.   MSK: no deformity of the feet CV: no leg edema Skin:  no ulcer on the feet.  normal color and temp on the feet. Neuro: sensation is intact to touch on the feet.     Lab Results  Component Value Date   HGBA1C 7.1 (A) 05/05/2021   Lab Results  Component Value Date   CREATININE 0.69 06/18/2020   BUN 13 06/18/2020   NA 137 06/18/2020   K 3.8 06/18/2020   CL 103 06/18/2020   CO2 26 06/18/2020      Assessment & Plan:  Type 2 DM: uncontrolled.   Patient Instructions  check your blood sugar once a day.  vary the time of day when you check, between before the 3 meals, and at bedtime.  also check if you have symptoms of your blood sugar being too high or too low.  please keep a record of the readings and bring it to your next appointment here (or you can bring the meter itself).  You can write it on any piece of paper.  please call 06/20/2020 sooner if your blood sugar goes below 70, or if you have a lot of readings over 200.  Please continue the same 3 medications.    I have sent a prescription to your pharmacy, to resume bromocriptine.   Please come back for a follow-up appointment in 3 months.

## 2021-05-05 NOTE — Patient Instructions (Addendum)
check your blood sugar once a day.  vary the time of day when you check, between before the 3 meals, and at bedtime.  also check if you have symptoms of your blood sugar being too high or too low.  please keep a record of the readings and bring it to your next appointment here (or you can bring the meter itself).  You can write it on any piece of paper.  please call us sooner if your blood sugar goes below 70, or if you have a lot of readings over 200.  Please continue the same 3 medications.    I have sent a prescription to your pharmacy, to resume bromocriptine.   Please come back for a follow-up appointment in 3 months.

## 2021-05-15 DIAGNOSIS — M25561 Pain in right knee: Secondary | ICD-10-CM | POA: Diagnosis not present

## 2021-05-15 DIAGNOSIS — E1165 Type 2 diabetes mellitus with hyperglycemia: Secondary | ICD-10-CM | POA: Diagnosis not present

## 2021-05-15 DIAGNOSIS — F17210 Nicotine dependence, cigarettes, uncomplicated: Secondary | ICD-10-CM | POA: Diagnosis not present

## 2021-05-15 DIAGNOSIS — Z1159 Encounter for screening for other viral diseases: Secondary | ICD-10-CM | POA: Diagnosis not present

## 2021-05-15 DIAGNOSIS — R0602 Shortness of breath: Secondary | ICD-10-CM | POA: Diagnosis not present

## 2021-05-15 DIAGNOSIS — R5383 Other fatigue: Secondary | ICD-10-CM | POA: Diagnosis not present

## 2021-05-15 DIAGNOSIS — Z0000 Encounter for general adult medical examination without abnormal findings: Secondary | ICD-10-CM | POA: Diagnosis not present

## 2021-05-15 DIAGNOSIS — E559 Vitamin D deficiency, unspecified: Secondary | ICD-10-CM | POA: Diagnosis not present

## 2021-05-21 DIAGNOSIS — J069 Acute upper respiratory infection, unspecified: Secondary | ICD-10-CM | POA: Diagnosis not present

## 2021-05-21 DIAGNOSIS — Z20822 Contact with and (suspected) exposure to covid-19: Secondary | ICD-10-CM | POA: Diagnosis not present

## 2021-05-25 ENCOUNTER — Encounter: Payer: Self-pay | Admitting: Endocrinology

## 2021-06-18 ENCOUNTER — Other Ambulatory Visit: Payer: Self-pay | Admitting: Endocrinology

## 2021-06-21 DIAGNOSIS — Z961 Presence of intraocular lens: Secondary | ICD-10-CM | POA: Diagnosis not present

## 2021-06-21 DIAGNOSIS — H401131 Primary open-angle glaucoma, bilateral, mild stage: Secondary | ICD-10-CM | POA: Diagnosis not present

## 2021-06-21 DIAGNOSIS — H04123 Dry eye syndrome of bilateral lacrimal glands: Secondary | ICD-10-CM | POA: Diagnosis not present

## 2021-06-21 DIAGNOSIS — H40053 Ocular hypertension, bilateral: Secondary | ICD-10-CM | POA: Diagnosis not present

## 2021-06-21 LAB — HM DIABETES EYE EXAM

## 2021-06-26 DIAGNOSIS — R062 Wheezing: Secondary | ICD-10-CM | POA: Diagnosis not present

## 2021-06-26 DIAGNOSIS — R0981 Nasal congestion: Secondary | ICD-10-CM | POA: Diagnosis not present

## 2021-06-26 DIAGNOSIS — R52 Pain, unspecified: Secondary | ICD-10-CM | POA: Diagnosis not present

## 2021-06-26 DIAGNOSIS — J329 Chronic sinusitis, unspecified: Secondary | ICD-10-CM | POA: Diagnosis not present

## 2021-06-27 DIAGNOSIS — N3010 Interstitial cystitis (chronic) without hematuria: Secondary | ICD-10-CM | POA: Diagnosis not present

## 2021-06-27 DIAGNOSIS — R3915 Urgency of urination: Secondary | ICD-10-CM | POA: Diagnosis not present

## 2021-07-12 DIAGNOSIS — F3341 Major depressive disorder, recurrent, in partial remission: Secondary | ICD-10-CM | POA: Diagnosis not present

## 2021-08-04 ENCOUNTER — Ambulatory Visit: Payer: Medicare HMO | Admitting: Endocrinology

## 2021-08-08 DIAGNOSIS — F324 Major depressive disorder, single episode, in partial remission: Secondary | ICD-10-CM | POA: Diagnosis not present

## 2021-08-08 DIAGNOSIS — R0981 Nasal congestion: Secondary | ICD-10-CM | POA: Diagnosis not present

## 2021-08-08 DIAGNOSIS — K219 Gastro-esophageal reflux disease without esophagitis: Secondary | ICD-10-CM | POA: Diagnosis not present

## 2021-08-08 DIAGNOSIS — E119 Type 2 diabetes mellitus without complications: Secondary | ICD-10-CM | POA: Diagnosis not present

## 2021-08-08 DIAGNOSIS — E7800 Pure hypercholesterolemia, unspecified: Secondary | ICD-10-CM | POA: Diagnosis not present

## 2021-08-08 DIAGNOSIS — I10 Essential (primary) hypertension: Secondary | ICD-10-CM | POA: Diagnosis not present

## 2021-08-08 DIAGNOSIS — M797 Fibromyalgia: Secondary | ICD-10-CM | POA: Diagnosis not present

## 2021-08-11 DIAGNOSIS — F3341 Major depressive disorder, recurrent, in partial remission: Secondary | ICD-10-CM | POA: Diagnosis not present

## 2021-08-19 ENCOUNTER — Other Ambulatory Visit: Payer: Self-pay

## 2021-08-19 ENCOUNTER — Encounter: Payer: Self-pay | Admitting: Endocrinology

## 2021-08-19 ENCOUNTER — Ambulatory Visit (INDEPENDENT_AMBULATORY_CARE_PROVIDER_SITE_OTHER): Payer: Medicare HMO | Admitting: Endocrinology

## 2021-08-19 VITALS — BP 146/86 | HR 85 | Ht 62.0 in | Wt 174.4 lb

## 2021-08-19 DIAGNOSIS — E1169 Type 2 diabetes mellitus with other specified complication: Secondary | ICD-10-CM

## 2021-08-19 DIAGNOSIS — E669 Obesity, unspecified: Secondary | ICD-10-CM | POA: Diagnosis not present

## 2021-08-19 LAB — POCT GLYCOSYLATED HEMOGLOBIN (HGB A1C): Hemoglobin A1C: 6.8 % — AB (ref 4.0–5.6)

## 2021-08-19 NOTE — Progress Notes (Signed)
Subjective:    Patient ID: Terri Daniel, female    DOB: 1956-03-28, 65 y.o.   MRN: [id number]  HPI Pt returns for f/u of diabetes mellitus: DM type: 2 Dx'ed: 2013 Complications: none.   Therapy: 3 oral meds GDM: never DKA: never Severe hypoglycemia: never Pancreatitis: never Pancreatic imaging: never SDOH: She says she cannot afford brand-name medications. Ins declined colesevelam.   Other: she has never taken insulin, but she has learned how; metformin-XR dosage is limited by diarrhea.   Interval history: no recent steroids.  Meter is downloaded today, and the printout is scanned into the record.  Cbg's are all approx 100.  All are checked 5AM-11AM--same as last time.   Past Medical History:  Diagnosis Date   Anxiety    Arthritis    Asthma    Bulging lumbar disc    Chronic interstitial cystitis    Depression    Diabetes mellitus without complication (HCC)    Disorder of left rotator cuff    Fibromyalgia    GERD (gastroesophageal reflux disease)    HLD (hyperlipidemia)    Hypertension    Tobacco abuse    Transaminitis     Past Surgical History:  Procedure Laterality Date   CYSTOSCOPY     JOINT REPLACEMENT Left 2009   Knee   LEFT HEART CATH AND CORONARY ANGIOGRAPHY N/A 02/07/2017   Procedure: Left Heart Cath and Coronary Angiography;  Surgeon: Lennette Bihari, MD;  Location: [facility] INVASIVE CV LAB;  Service: Cardiovascular;  Laterality: N/A;    Social History   Socioeconomic History   Marital status: Divorced    Spouse name: Not on file   Number of children: 2   Years of education: 16   Highest education level: Not on file  Occupational History   Occupation: disabled  Tobacco Use   Smoking status: Every Day    Packs/day: 0.50    Types: Cigarettes   Smokeless tobacco: Never   Tobacco comments:    27 years total as of 2018  Vaping Use   Vaping Use: Never used  Substance and Sexual Activity   Alcohol use: No   Drug use: No   Sexual activity: Not on file   Other Topics Concern   Not on file  Social History Narrative   Lives alone in a one story home.  Has 2 children.  On disability due to interstitial cystitis.  Education: college.    Social Determinants of Health   Financial Resource Strain: Not on file  Food Insecurity: Not on file  Transportation Needs: Not on file  Physical Activity: Not on file  Stress: Not on file  Social Connections: Not on file  Intimate Partner Violence: Not on file    Current Outpatient Medications on File Prior to Visit  Medication Sig Dispense Refill   ACCU-CHEK GUIDE test strip USE DAILY AS DIRECTED 100 strip 3   albuterol (PROVENTIL HFA;VENTOLIN HFA) 108 (90 Base) MCG/ACT inhaler Inhale into the lungs every 6 (six) hours as needed for wheezing or shortness of breath.     bromocriptine (PARLODEL) 2.5 MG tablet Take 1 tablet (2.5 mg total) by mouth daily. 45 tablet 3   buPROPion (WELLBUTRIN XL) 150 MG 24 hr tablet Take 150 mg by mouth daily.     diclofenac sodium (VOLTAREN) 1 % GEL Place 1 application onto the skin 2 (two) times daily as needed.     ezetimibe (ZETIA) 10 MG tablet Take 10 mg by mouth daily.  fluticasone (FLONASE) 50 MCG/ACT nasal spray Place into both nostrils daily.     hydrOXYzine (ATARAX/VISTARIL) 25 MG tablet Take 25 mg once as needed by mouth.     lisinopril-hydrochlorothiazide (PRINZIDE,ZESTORETIC) 20-12.5 MG tablet Take 2 tablets by mouth daily.     metFORMIN (GLUCOPHAGE-XR) 500 MG 24 hr tablet Take 1 tablet (500 mg total) by mouth daily with breakfast. 90 tablet 3   omeprazole (PRILOSEC) 40 MG capsule      pioglitazone (ACTOS) 15 MG tablet Take 1 tablet (15 mg total) by mouth daily. 90 tablet 3   rosuvastatin (CRESTOR) 40 MG tablet Take 40 mg by mouth daily.     valACYclovir (VALTREX) 1000 MG tablet Take 1,000 mg by mouth 2 (two) times daily.     cetirizine (ZYRTEC) 10 MG tablet Take 10 mg by mouth daily.     Nutritional Supplements (NUTRITIONAL SUPPLEMENT PO) Take 1,250 mg by  mouth.     No current facility-administered medications on file prior to visit.    Allergies  Allergen Reactions   Other Shortness Of Breath   Propoxyphene Shortness Of Breath   Doxycycline Nausea And Vomiting   Erythromycin Base Rash   Sulfa Antibiotics Rash   Contrast Media [Iodinated Diagnostic Agents] Hives    CT in 2004   Prednisone     Heart pounding, rash     Family History  Problem Relation Age of Onset   CAD Mother        stent in her 21s   Atrial fibrillation Mother    CAD Father        CABG age 61   Diabetes Father    CAD Brother        "widow maker" MI before age 54 requiring stent   Diabetes Mellitus II Brother     BP (!) 146/86 (BP Location: Left Arm, Patient Position: Sitting, Cuff Size: Normal)    Pulse 85    Ht 5\' 2"  (1.575 m)    Wt 174 lb 6.4 oz (79.1 kg)    SpO2 96%    BMI 31.90 kg/m    Review of Systems     Objective:   Physical Exam VITAL SIGNS:  See vs page GENERAL: no distress.  Ext: no leg edema.    Lab Results  Component Value Date   CREATININE 0.69 06/18/2020   BUN 13 06/18/2020   NA 137 06/18/2020   K 3.8 06/18/2020   CL 103 06/18/2020   CO2 26 06/18/2020    A1c=6.8%    Assessment & Plan:  Type 2 DM: well-controlled  Patient Instructions  check your blood sugar once a day.  vary the time of day when you check, between before the 3 meals, and at bedtime.  also check if you have symptoms of your blood sugar being too high or too low.  please keep a record of the readings and bring it to your next appointment here (or you can bring the meter itself).  You can write it on any piece of paper.  please call 06/20/2020 sooner if your blood sugar goes below 70, or if you have a lot of readings over 200.   Please continue the same 3 diabetes medications.   Please come back for a follow-up appointment in 3 months.

## 2021-08-19 NOTE — Patient Instructions (Signed)
check your blood sugar once a day.  vary the time of day when you check, between before the 3 meals, and at bedtime.  also check if you have symptoms of your blood sugar being too high or too low.  please keep a record of the readings and bring it to your next appointment here (or you can bring the meter itself).  You can write it on any piece of paper.  please call us sooner if your blood sugar goes below 70, or if you have a lot of readings over 200.   Please continue the same 3 diabetes medications.   Please come back for a follow-up appointment in 3 months.  

## 2021-08-24 DIAGNOSIS — F3341 Major depressive disorder, recurrent, in partial remission: Secondary | ICD-10-CM | POA: Diagnosis not present

## 2021-09-07 DIAGNOSIS — I10 Essential (primary) hypertension: Secondary | ICD-10-CM | POA: Diagnosis not present

## 2021-09-07 DIAGNOSIS — E7800 Pure hypercholesterolemia, unspecified: Secondary | ICD-10-CM | POA: Diagnosis not present

## 2021-09-17 ENCOUNTER — Telehealth: Payer: Medicare HMO | Admitting: Nurse Practitioner

## 2021-09-17 DIAGNOSIS — K582 Mixed irritable bowel syndrome: Secondary | ICD-10-CM | POA: Diagnosis not present

## 2021-09-17 NOTE — Patient Instructions (Signed)
Diet for Irritable Bowel Syndrome °When you have irritable bowel syndrome (IBS), it is very important to eat the foods and follow the eating habits that are best for your condition. IBS may cause various symptoms such as pain in the abdomen, constipation, or diarrhea. Choosing the right foods can help to ease the discomfort from these symptoms. Work with your health care provider and diet and nutrition specialist (dietitian) to find the eating plan that will help to control your symptoms. °What are tips for following this plan? °  °Keep a food diary. This will help you identify foods that cause symptoms. Write down: °What you eat and when you eat it. °What symptoms you have. °When symptoms occur in relation to your meals, such as "pain in abdomen 2 hours after dinner." °Eat your meals slowly and in a relaxed setting. °Aim to eat 5-6 small meals per day. Do not skip meals. °Drink enough fluid to keep your urine pale yellow. °Ask your health care provider if you should take an over-the-counter probiotic to help restore healthy bacteria in your gut (digestive tract). °Probiotics are foods that contain good bacteria and yeasts. °Your dietitian may have specific dietary recommendations for you based on your symptoms. He or she may recommend that you: °Avoid foods that cause symptoms. Talk with your dietitian about other ways to get the same nutrients that are in those problem foods. °Avoid foods with gluten. Gluten is a protein that is found in rye, wheat, and barley. °Eat more foods that contain soluble fiber. Examples of foods with high soluble fiber include oats, seeds, and certain fruits and vegetables. Take a fiber supplement if directed by your dietitian. °Reduce or avoid certain foods called FODMAPs. These are foods that contain carbohydrates that are hard to digest. Ask your doctor which foods contain these carbohydrates. °What foods are not recommended? °The following are some foods and drinks that may make your  symptoms worse: °Fatty foods, such as french fries. °Foods that contain gluten, such as pasta and cereal. °Dairy products, such as milk, cheese, and ice cream. °Chocolate. °Alcohol. °Products with caffeine, such as coffee. °Carbonated drinks, such as soda. °Foods that are high in FODMAPs. These include certain fruits and vegetables. °Products with sweeteners such as honey, high fructose corn syrup, sorbitol, and mannitol. °The items listed above may not be a complete list of foods and beverages you should avoid. Contact a dietitian for more information. °What foods are good sources of fiber? °Your health care provider or dietitian may recommend that you eat more foods that contain fiber. Fiber can help to reduce constipation and other IBS symptoms. Add foods with fiber to your diet a little at a time so your body can get used to them. Too much fiber at one time might cause gas and swelling of your abdomen. The following are some foods that are good sources of fiber: °Berries, such as raspberries, strawberries, and blueberries. °Tomatoes. °Carrots. °Brown rice. °Oats. °Seeds, such as chia and pumpkin seeds. °The items listed above may not be a complete list of recommended sources of fiber. Contact your dietitian for more options. °Where to find more information °International Foundation for Functional Gastrointestinal Disorders: www.iffgd.org °National Institute of Diabetes and Digestive and Kidney Diseases: www.niddk.nih.gov °Summary °When you have irritable bowel syndrome (IBS), it is very important to eat the foods and follow the eating habits that are best for your condition. °IBS may cause various symptoms such as pain in the abdomen, constipation, or diarrhea. °Choosing the right   foods can help to ease the discomfort that comes from symptoms. °Keep a food diary. This will help you identify foods that cause symptoms. °Your health care provider or diet and nutrition specialist (dietitian) may recommend that you  eat more foods that contain fiber. °This information is not intended to replace advice given to you by your health care provider. Make sure you discuss any questions you have with your health care provider. °Document Revised: 04/15/2020 Document Reviewed: 04/22/2020 °Elsevier Patient Education © 2022 [employer] ° °

## 2021-09-17 NOTE — Progress Notes (Signed)
Virtual Visit Consent   Terri Daniel, you are scheduled for a virtual visit with Terri Daphine Deutscher, FNP, a [facility] provider, today.     Just as with appointments in the office, your consent must be obtained to participate.  Your consent will be active for this visit and any virtual visit you may have with one of our providers in the next 365 days.     If you have a MyChart account, a copy of this consent can be sent to you electronically.  All virtual visits are billed to your insurance company just like a traditional visit in the office.    As this is a virtual visit, video technology does not allow for your provider to perform a traditional examination.  This may limit your provider's ability to fully assess your condition.  If your provider identifies any concerns that need to be evaluated in person or the need to arrange testing (such as labs, EKG, etc.), we will make arrangements to do so.     Although advances in technology are sophisticated, we cannot ensure that it will always work on either your end or our end.  If the connection with a video visit is poor, the visit may have to be switched to a telephone visit.  With either a video or telephone visit, we are not always able to ensure that we have a secure connection.     I need to obtain your verbal consent now.   Are you willing to proceed with your visit today? YES   Terri Daniel has provided verbal consent on 09/17/2021 for a virtual visit (video or telephone).   Terri Daphine Deutscher, FNP   Date: 09/17/2021 9:17 AM   Virtual Visit via Video Note   I, Terri Daniel, connected with Terri Daniel ([id number], Jan 24, 1956) on 09/17/21 at  9:30 AM EST by a video-enabled telemedicine application and verified that I am speaking with the correct person using two identifiers.  Location: Patient: Virtual Visit Location Patient: Home Provider: Virtual Visit Location Provider: Mobile   I discussed the limitations  of evaluation and management by telemedicine and the availability of in person appointments. The patient expressed understanding and agreed to proceed.    History of Present Illness: Terri Daniel is a 66 y.o. who identifies as a female who was assigned female at birth, and is being seen today for diarrhea.  HPI: Diarrhea  This is a new problem. The current episode started in the past 7 days. The problem occurs 5 to 10 times per day. The problem has been unchanged. The stool consistency is described as Watery. The patient states that diarrhea awakens her from sleep. Associated symptoms include chills (intermittent). Pertinent negatives include no abdominal pain, fever or vomiting. Nothing aggravates the symptoms. She has tried nothing for the symptoms. Her past medical history is significant for inflammatory bowel disease.   Review of Systems  Constitutional:  Positive for chills (intermittent). Negative for fever.  Gastrointestinal:  Positive for diarrhea. Negative for abdominal pain and vomiting.   Problems:  Patient Active Problem List   Diagnosis Date Noted   Chronic pain of right knee 10/30/2019   Genital warts 04/16/2018   Herpes simplex vulvovaginitis 04/16/2018   Ulnar neuropathy at elbow of right upper extremity 02/15/2017   HTN (hypertension) 02/06/2017   Chest pain 02/06/2017   Diabetes mellitus type 2 in obese (HCC) 02/06/2017   Fibromyalgia 02/06/2017   Leukocytosis 02/06/2017   Chronic interstitial cystitis 02/06/2017  Obesity (BMI 30.0-34.9) 02/06/2017   HLD (hyperlipidemia) 02/06/2017   Rotator cuff dysfunction, left 02/06/2017   Asthma 02/06/2017   Primary open angle glaucoma (POAG) of left eye, mild stage 01/29/2017   Primary open angle glaucoma (POAG) of right eye, mild stage 01/29/2017   Right ankle pain 12/30/2015   Impingement syndrome of right ankle 12/30/2015   Mild persistent asthma without complication [id number]   Dysphagia 11/15/2015   Elevated liver  enzymes 11/15/2015   Family history of colonic polyps 11/14/2015   Benign essential hypertension 10/20/2015   Esophageal reflux 10/20/2015   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   Hematuria 09/09/2013   Anxiety 10/12/2011   Depression 10/12/2011   Irritable bowel syndrome 10/12/2011   Increased frequency of urination 07/02/2011   Nocturia 07/02/2011    Allergies:  Allergies  Allergen Reactions   Other Shortness Of Breath   Propoxyphene Shortness Of Breath   Doxycycline Nausea And Vomiting   Erythromycin Base Rash   Sulfa Antibiotics Rash   Contrast Media [Iodinated Contrast Media] Hives    CT in 2004   Prednisone     Heart pounding, rash    Medications:  Current Outpatient Medications:    ACCU-CHEK GUIDE test strip, USE DAILY AS DIRECTED, Disp: 100 strip, Rfl: 3   albuterol (PROVENTIL HFA;VENTOLIN HFA) 108 (90 Base) MCG/ACT inhaler, Inhale into the lungs every 6 (six) hours as needed for wheezing or shortness of breath., Disp: , Rfl:    bromocriptine (PARLODEL) 2.5 MG tablet, Take 1 tablet (2.5 mg total) by mouth daily., Disp: 45 tablet, Rfl: 3   buPROPion (WELLBUTRIN XL) 150 MG 24 hr tablet, Take 150 mg by mouth daily., Disp: , Rfl:    cetirizine (ZYRTEC) 10 MG tablet, Take 10 mg by mouth daily., Disp: , Rfl:    diclofenac sodium (VOLTAREN) 1 % GEL, Place 1 application onto the skin 2 (two) times daily as needed., Disp: , Rfl:    ezetimibe (ZETIA) 10 MG tablet, Take 10 mg by mouth daily., Disp: , Rfl:    fluticasone (FLONASE) 50 MCG/ACT nasal spray, Place into both nostrils daily., Disp: , Rfl:    hydrOXYzine (ATARAX/VISTARIL) 25 MG tablet, Take 25 mg once as needed by mouth., Disp: , Rfl:    lisinopril-hydrochlorothiazide (PRINZIDE,ZESTORETIC) 20-12.5 MG tablet, Take 2 tablets by mouth daily., Disp: , Rfl:    metFORMIN (GLUCOPHAGE-XR) 500 MG 24 hr tablet, Take 1 tablet (500 mg total) by mouth daily with breakfast., Disp: 90 tablet, Rfl: 3   Nutritional Supplements (NUTRITIONAL  SUPPLEMENT PO), Take 1,250 mg by mouth., Disp: , Rfl:    omeprazole (PRILOSEC) 40 MG capsule, , Disp: , Rfl:    pioglitazone (ACTOS) 15 MG tablet, Take 1 tablet (15 mg total) by mouth daily., Disp: 90 tablet, Rfl: 3   rosuvastatin (CRESTOR) 40 MG tablet, Take 40 mg by mouth daily., Disp: , Rfl:    valACYclovir (VALTREX) 1000 MG tablet, Take 1,000 mg by mouth 2 (two) times daily., Disp: , Rfl:   Observations/Objective: Patient is well-developed, well-nourished in no acute distress.  Resting comfortably  at home.  Head is normocephalic, atraumatic.  No labored breathing.  Speech is clear and coherent with logical content.  Patient is alert and oriented at baseline.    Assessment and Plan:  Terri Daniel in today with chief complaint of Diarrhea   1. Irritable bowel syndrome with both constipation and diarrhea Imodium AD OTC as needed Increase fiber in diet Add probiotic RTO if no improvement in 48 hours.  Follow Up Instructions: I discussed the assessment and treatment plan with the patient. The patient was provided an opportunity to ask questions and all were answered. The patient agreed with the plan and demonstrated an understanding of the instructions.  A copy of instructions were sent to the patient via MyChart.  The patient was advised to call back or seek an in-person evaluation if the symptoms worsen or if the condition fails to improve as anticipated.  Time:  I spent 13 minutes with the patient via telehealth technology discussing the above problems/concerns.    Terri Hassell Done, FNP

## 2021-10-03 NOTE — Progress Notes (Signed)
[facility]  10/05/2021     CHIEF COMPLAINT Patient presents for Retina Evaluation   HISTORY OF PRESENT ILLNESS: Terri Daniel is a 66 y.o. female who presents to the clinic today for:   HPI     Retina Evaluation   In both eyes.  This started years ago.  Duration of years.  Associated Symptoms Floaters, Pain, Photophobia and Glare.  Negative for Flashes, Distortion, Blind Spot, Redness, Trauma, Scalp Tenderness, Jaw Claudication, Shoulder/Hip pain, Fever, Weight Loss and Fatigue.  Context:  distance vision, mid-range vision and near vision.  Treatments tried include eye drops and artificial tears.  Response to treatment was no improvement.  I, the attending physician,  performed the HPI with the patient and updated documentation appropriately.        Comments   66 y/o female pt referred by Dr. Kathlen Mody for eval of possible retinal toxicity.  Pt last seen by [facility] 10.18.22.  Pt reports that her vision has been in a constant state of flux since her cat sxs in late 2021, and that no eye doctor can figure out why.  She states that glasses aren't much help, and that her Rx is constantly changing.  VA blurred OU at distance and near x several yrs.  Has occasional ocular pain.  Denies FOL, but is noticing more floaters OU.  Also c/o issues w/glare and photophobia.  Pt was on Elmeron for yrs, and is now part of a Equities trader lawsuit as a result. BS 122 this a.m.  A1C 6.7 in December of 2022.  BP under good control.  Lumigan QHS OU.  AT prn OU.      Last edited by Bernarda Caffey, MD on 10/09/2021  1:21 PM.    Pt is here on the referral of Dr. 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,  [place] [id number]  HISTORICAL INFORMATION:   Selected notes from the MEDICAL RECORD NUMBER Referred by Dr. Kathlen Mody for concern of retinal toxicity (Elmiron exposure) LEE:  Ocular Hx- PMH-    CURRENT MEDICATIONS: Current Outpatient Medications (Ophthalmic Drugs)  Medication Sig   bimatoprost (LUMIGAN) 0.01 % SOLN 1 drop into affected eye in the evening   SYSTANE ULTRA 0.4-0.3 % SOLN SMARTSIG:1 Drop(s) In Eye(s)   No current facility-administered medications for this visit. (Ophthalmic Drugs)   Current Outpatient Medications (Other)  Medication Sig   ACCU-CHEK GUIDE test strip USE DAILY AS DIRECTED   albuterol (PROVENTIL HFA;VENTOLIN HFA) 108 (90 Base) MCG/ACT inhaler Inhale into the lungs every 6 (six) hours as needed for wheezing or shortness of breath.   atorvastatin (LIPITOR) 10 MG tablet    bromocriptine (PARLODEL) 2.5 MG tablet Take 1 tablet (2.5 mg total) by mouth daily.   buPROPion (WELLBUTRIN XL) 150 MG 24 hr tablet Take 150 mg by mouth daily.   citalopram (CELEXA) 40 MG tablet Take by mouth.  diclofenac sodium (VOLTAREN) 1 % GEL Place 1 application onto the skin 2 (two) times daily as needed.   ezetimibe (ZETIA) 10 MG tablet Take 10 mg by mouth daily.   FLUoxetine (PROZAC) 10 MG capsule Take by mouth.   fluticasone (FLONASE) 50 MCG/ACT nasal spray Place into both nostrils daily.   fluticasone (FLONASE) 50 MCG/ACT nasal spray 1 spray in each nostril   hydrOXYzine (ATARAX/VISTARIL) 25 MG tablet Take 25 mg once as needed by mouth.   hyoscyamine (LEVBID) 0.375 MG 12 hr tablet 1 tablet   lisinopril-hydrochlorothiazide (PRINZIDE,ZESTORETIC) 20-12.5 MG tablet Take 2 tablets by mouth daily.   meloxicam (MOBIC) 15 MG tablet 1 tablet   metFORMIN  (GLUCOPHAGE-XR) 500 MG 24 hr tablet Take 1 tablet (500 mg total) by mouth daily with breakfast.   metFORMIN (GLUCOPHAGE-XR) 500 MG 24 hr tablet 1 tablet with evening meal   nortriptyline (PAMELOR) 25 MG capsule    omeprazole (PRILOSEC) 40 MG capsule    OXcarbazepine (TRILEPTAL) 150 MG tablet Take by mouth.   pioglitazone (ACTOS) 15 MG tablet Take 1 tablet (15 mg total) by mouth daily.   rosuvastatin (CRESTOR) 40 MG tablet Take 40 mg by mouth daily.   tamsulosin (FLOMAX) 0.4 MG CAPS capsule 1 capsule   tiZANidine (ZANAFLEX) 4 MG tablet    valACYclovir (VALTREX) 1000 MG tablet Take 1,000 mg by mouth 2 (two) times daily.   bromocriptine (PARLODEL) 2.5 MG tablet 1/4 tablet (Patient not taking: Reported on 10/05/2021)   cetirizine (ZYRTEC) 10 MG tablet Take 10 mg by mouth daily.   Nutritional Supplements (NUTRITIONAL SUPPLEMENT PO) Take 1,250 mg by mouth.   omeprazole (PRILOSEC) 40 MG capsule 1 capsule (Patient not taking: Reported on 10/05/2021)   No current facility-administered medications for this visit. (Other)   REVIEW OF SYSTEMS: ROS   Positive for: Gastrointestinal, Neurological, Musculoskeletal, Endocrine, Eyes, Respiratory Negative for: Constitutional, Skin, Genitourinary, HENT, Cardiovascular, Psychiatric, Allergic/Imm, Heme/Lymph Last edited by Matthew Folks, COA on 10/05/2021  1:30 PM.     ALLERGIES Allergies  Allergen Reactions   Other Shortness Of Breath   Propoxyphene Shortness Of Breath   Doxycycline Nausea And Vomiting   Erythromycin Base Rash   Sulfa Antibiotics Rash   Contrast Media [Iodinated Contrast Media] Hives    CT in 2004   Prednisone     Heart pounding, rash    PAST MEDICAL HISTORY Past Medical History:  Diagnosis Date   Anxiety    Arthritis    Asthma    Bulging lumbar disc    Chronic interstitial cystitis    Depression    Diabetes mellitus without complication (HCC)    Disorder of left rotator cuff    Fibromyalgia    GERD (gastroesophageal reflux  disease)    Glaucoma    HLD (hyperlipidemia)    Hypertension    Hypertensive retinopathy    Tobacco abuse    Transaminitis    Past Surgical History:  Procedure Laterality Date   CATARACT EXTRACTION     CYSTOSCOPY     EYE SURGERY     JOINT REPLACEMENT Left 2009   Knee   LEFT HEART CATH AND CORONARY ANGIOGRAPHY N/A 02/07/2017   Procedure: Left Heart Cath and Coronary Angiography;  Surgeon: Troy Sine, MD;  Location: [facility];  Service: Cardiovascular;  Laterality: N/A;   FAMILY HISTORY Family History  Problem Relation Age of Onset   CAD Mother        stent in her 63s   Atrial  fibrillation Mother    CAD Father        CABG age 23   Diabetes Father    CAD Brother        "widow maker" MI before age 45 requiring stent   Diabetes Mellitus II Brother    SOCIAL HISTORY Social History   Tobacco Use   Smoking status: Every Day    Packs/day: 0.50    Types: Cigarettes   Smokeless tobacco: Never   Tobacco comments:    27 years total as of 2018  Vaping Use   Vaping Use: Never used  Substance Use Topics   Alcohol use: No   Drug use: No       OPHTHALMIC EXAM:  Base Eye Exam     Visual Acuity (Snellen - Linear)       Right Left   Dist [place] 20/40 -2 20/80 -2   Dist ph  20/20 -2 20/20 -2         Tonometry (Tonopen, 1:38 PM)       Right Left   Pressure 15 12         Pupils       Dark Light Shape React APD   Right 3 2 Round Brisk None   Left 3 2 Round Brisk None         Visual Fields (Counting fingers)       Left Right    Full Full         Extraocular Movement       Right Left    Full, Ortho Full, Ortho         Neuro/Psych     Oriented x3: Yes   Mood/Affect: Normal         Dilation     Both eyes: 1.0% Mydriacyl, 2.5% Phenylephrine @ 1:38 PM           Slit Lamp and Fundus Exam     Slit Lamp Exam       Right Left   Lids/Lashes Dermatochalasis - upper lid, +errythema, Telangiectasia Dermatochalasis - upper lid,  +errythema, Telangiectasia   Conjunctiva/Sclera White and quiet White and quiet   Cornea arcus, well healed cataract wound, mild tear film debris arcus, well healed cataract wound, mild tear film debris   Anterior Chamber Deep and quiet Deep and quiet   Iris Round and reactive Round and reactive   Lens PC IOL in good position PC IOL in good position   Anterior Vitreous Vitreous syneresis Vitreous syneresis         Fundus Exam       Right Left   Disc pink, sharp, +cupping, central pallor pink, sharp, +cupping, central pallor   C/D Ratio 0.65 0.6   Macula Flat, Good foveal reflex, RPE mottling, No heme or edema Flat, Good foveal reflex, RPE mottling, No heme or edema   Vessels mild attenuation, mild Copper wiring mild attenuation, mild Copper wiring   Periphery Attached, no edema Attached, no edema           Refraction     Manifest Refraction       Sphere Cylinder Axis Dist VA   Right -1.00 +1.50 012 20/20-   Left -1.50 +1.25 012 20/20-           IMAGING AND PROCEDURES  Imaging and Procedures for 10/05/2021  OCT, Retina - OU - Both Eyes       Right Eye Quality was good. Central Foveal Thickness: 267. Progression has no prior  data. Findings include normal foveal contour, no IRF, no SRF, vitreomacular adhesion , myopic contour (No ellipsoid loss).   Left Eye Quality was good. Central Foveal Thickness: 267. Progression has no prior data. Findings include normal foveal contour, no IRF, no SRF, myopic contour, vitreomacular adhesion (No ellipsoid loss).   Notes *Images captured and stored on drive  Diagnosis / Impression:  NFP, no IRF/SRF OU No ellipsoid loss OU  Clinical management:  See below  Abbreviations: NFP - Normal foveal profile. CME - cystoid macular edema. PED - pigment epithelial detachment. IRF - intraretinal fluid. SRF - subretinal fluid. EZ - ellipsoid zone. ERM - epiretinal membrane. ORA - outer retinal atrophy. ORT - outer retinal tubulation. SRHM -  subretinal hyper-reflective material. IRHM - intraretinal hyper-reflective material      Autofluorescence Optos - OU - Both Eyes       Right Eye Progression has no prior data. Increased autofluorescence was noted in no areas. Decreased autofluorescence was noted in no areas.   Left Eye Progression has no prior data. Increased autofluorescence was noted in no areas. Decreased autofluorescence was noted in no areas.   Notes **Images stored on drive**  Impression: Normal study OU No speckling or autofluorescent changes OU            ASSESSMENT/PLAN:    ICD-10-CM   1. Chronic interstitial cystitis  N30.10 Autofluorescence Optos - OU - Both Eyes    2. Diabetes mellitus type 2 without retinopathy ([place])  E11.9 OCT, Retina - OU - Both Eyes    Autofluorescence Optos - OU - Both Eyes    3. Essential hypertension  I10     4. Hypertensive retinopathy of both eyes  H35.033     5. Pseudophakia, both eyes  Z96.1       1. Interstitial cystitis w/ h/o Elmiron use  - pt w/ history of chronic IC, was on Elmiron for 5-6 yrs from 2006-~2011 or '12  - no retinal toxicity on exam or fundus autofluorescence imaging  - monitor  2. Diabetes mellitus, type 2 without retinopathy - The incidence, risk factors for progression, natural history and treatment options for diabetic retinopathy  were discussed with patient.   - The need for close monitoring of blood glucose, blood pressure, and serum lipids, avoiding cigarette or any type of tobacco, and the need for long term follow up was also discussed with patient. - monitor  3,4. Hypertensive retinopathy OU - discussed importance of tight BP control - monitor  5. Pseudophakia OU  - s/p CE/IOL (Dr. Kathlen Mody, 2021)  - IOL in good position, doing well  - monitor   Ophthalmic Meds Ordered this visit:  No orders of the defined types were placed in this encounter.    Return if symptoms worsen or fail to improve.  There are no Patient  Instructions on file for this visit.  Explained the diagnoses, plan, and follow up with the patient and they expressed understanding.  Patient expressed understanding of the importance of proper follow up care.   This document serves as a record of services personally performed by Gardiner Sleeper, MD, PhD. It was created on their behalf by Orvan Falconer, an ophthalmic technician. The creation of this record is the provider's dictation and/or activities during the visit.    Electronically signed by: Orvan Falconer, OA, 10/09/21  1:35 PM  Gardiner Sleeper, M.D., Ph.D. Diseases & Surgery of the Retina and Vitreous [place]  I have reviewed  the above documentation for accuracy and completeness, and I agree with the above. Gardiner Sleeper, M.D., Ph.D. 10/09/21 1:37 PM  Abbreviations: M myopia (nearsighted); A astigmatism; H hyperopia (farsighted); P presbyopia; Mrx spectacle prescription;  CTL contact lenses; OD right eye; OS left eye; OU both eyes  XT exotropia; ET esotropia; PEK punctate epithelial keratitis; PEE punctate epithelial erosions; DES dry eye syndrome; MGD meibomian gland dysfunction; ATs artificial tears; PFAT's preservative free artificial tears; [place] nuclear sclerotic cataract; PSC posterior subcapsular cataract; ERM epi-retinal membrane; PVD posterior vitreous detachment; RD retinal detachment; DM diabetes mellitus; DR diabetic retinopathy; NPDR non-proliferative diabetic retinopathy; PDR proliferative diabetic retinopathy; CSME clinically significant macular edema; DME diabetic macular edema; dbh dot blot hemorrhages; CWS cotton wool spot; POAG primary open angle glaucoma; C/D cup-to-disc ratio; HVF humphrey visual field; GVF goldmann visual field; OCT optical coherence tomography; IOP intraocular pressure; BRVO Branch retinal vein occlusion; CRVO central retinal vein occlusion; CRAO central retinal artery occlusion; BRAO branch retinal artery occlusion; RT  retinal tear; SB scleral buckle; PPV pars plana vitrectomy; VH Vitreous hemorrhage; PRP panretinal laser photocoagulation; IVK intravitreal kenalog; VMT vitreomacular traction; MH Macular hole;  NVD neovascularization of the disc; NVE neovascularization elsewhere; AREDS age related eye disease study; ARMD age related macular degeneration; POAG primary open angle glaucoma; EBMD epithelial/anterior basement membrane dystrophy; ACIOL anterior chamber intraocular lens; IOL intraocular lens; PCIOL posterior chamber intraocular lens; Phaco/IOL phacoemulsification with intraocular lens placement; [place] photorefractive keratectomy; LASIK laser assisted in situ keratomileusis; HTN hypertension; DM diabetes mellitus; COPD chronic obstructive pulmonary disease

## 2021-10-05 ENCOUNTER — Ambulatory Visit (INDEPENDENT_AMBULATORY_CARE_PROVIDER_SITE_OTHER): Payer: Medicare HMO | Admitting: Ophthalmology

## 2021-10-05 ENCOUNTER — Encounter (INDEPENDENT_AMBULATORY_CARE_PROVIDER_SITE_OTHER): Payer: Self-pay | Admitting: Ophthalmology

## 2021-10-05 ENCOUNTER — Other Ambulatory Visit: Payer: Self-pay

## 2021-10-05 DIAGNOSIS — H35033 Hypertensive retinopathy, bilateral: Secondary | ICD-10-CM

## 2021-10-05 DIAGNOSIS — E119 Type 2 diabetes mellitus without complications: Secondary | ICD-10-CM

## 2021-10-05 DIAGNOSIS — H3581 Retinal edema: Secondary | ICD-10-CM

## 2021-10-05 DIAGNOSIS — I10 Essential (primary) hypertension: Secondary | ICD-10-CM

## 2021-10-05 DIAGNOSIS — Z961 Presence of intraocular lens: Secondary | ICD-10-CM | POA: Diagnosis not present

## 2021-10-05 DIAGNOSIS — N3010 Interstitial cystitis (chronic) without hematuria: Secondary | ICD-10-CM

## 2021-10-09 ENCOUNTER — Encounter (INDEPENDENT_AMBULATORY_CARE_PROVIDER_SITE_OTHER): Payer: Self-pay | Admitting: Ophthalmology

## 2021-10-24 DIAGNOSIS — N3010 Interstitial cystitis (chronic) without hematuria: Secondary | ICD-10-CM | POA: Diagnosis not present

## 2021-10-24 DIAGNOSIS — R350 Frequency of micturition: Secondary | ICD-10-CM | POA: Diagnosis not present

## 2021-10-24 DIAGNOSIS — N9489 Other specified conditions associated with female genital organs and menstrual cycle: Secondary | ICD-10-CM | POA: Diagnosis not present

## 2021-10-24 DIAGNOSIS — J449 Chronic obstructive pulmonary disease, unspecified: Secondary | ICD-10-CM | POA: Diagnosis not present

## 2021-10-24 DIAGNOSIS — R3915 Urgency of urination: Secondary | ICD-10-CM | POA: Diagnosis not present

## 2021-11-16 DIAGNOSIS — F3341 Major depressive disorder, recurrent, in partial remission: Secondary | ICD-10-CM | POA: Diagnosis not present

## 2021-11-21 DIAGNOSIS — F3341 Major depressive disorder, recurrent, in partial remission: Secondary | ICD-10-CM | POA: Diagnosis not present

## 2021-11-24 DIAGNOSIS — H401131 Primary open-angle glaucoma, bilateral, mild stage: Secondary | ICD-10-CM | POA: Diagnosis not present

## 2021-12-12 DIAGNOSIS — F3341 Major depressive disorder, recurrent, in partial remission: Secondary | ICD-10-CM | POA: Diagnosis not present

## 2021-12-22 DIAGNOSIS — H401131 Primary open-angle glaucoma, bilateral, mild stage: Secondary | ICD-10-CM | POA: Diagnosis not present

## 2021-12-23 ENCOUNTER — Ambulatory Visit: Payer: Medicare HMO | Admitting: Endocrinology

## 2022-01-10 DIAGNOSIS — F3341 Major depressive disorder, recurrent, in partial remission: Secondary | ICD-10-CM | POA: Diagnosis not present

## 2022-01-10 DIAGNOSIS — Z961 Presence of intraocular lens: Secondary | ICD-10-CM | POA: Diagnosis not present

## 2022-01-10 DIAGNOSIS — H401131 Primary open-angle glaucoma, bilateral, mild stage: Secondary | ICD-10-CM | POA: Diagnosis not present

## 2022-01-17 ENCOUNTER — Emergency Department (HOSPITAL_BASED_OUTPATIENT_CLINIC_OR_DEPARTMENT_OTHER)
Admission: EM | Admit: 2022-01-17 | Discharge: 2022-01-17 | Disposition: A | Discharge status: Home / Self Care | Payer: Medicare HMO | Attending: Emergency Medicine | Admitting: Emergency Medicine

## 2022-01-17 ENCOUNTER — Encounter (HOSPITAL_BASED_OUTPATIENT_CLINIC_OR_DEPARTMENT_OTHER): Payer: Self-pay

## 2022-01-17 ENCOUNTER — Emergency Department (HOSPITAL_BASED_OUTPATIENT_CLINIC_OR_DEPARTMENT_OTHER): Payer: Medicare HMO

## 2022-01-17 ENCOUNTER — Other Ambulatory Visit: Payer: Self-pay

## 2022-01-17 DIAGNOSIS — S060X0A Concussion without loss of consciousness, initial encounter: Secondary | ICD-10-CM | POA: Diagnosis not present

## 2022-01-17 DIAGNOSIS — R197 Diarrhea, unspecified: Secondary | ICD-10-CM

## 2022-01-17 DIAGNOSIS — J4530 Mild persistent asthma, uncomplicated: Secondary | ICD-10-CM | POA: Diagnosis not present

## 2022-01-17 DIAGNOSIS — E119 Type 2 diabetes mellitus without complications: Secondary | ICD-10-CM | POA: Diagnosis not present

## 2022-01-17 DIAGNOSIS — I10 Essential (primary) hypertension: Secondary | ICD-10-CM | POA: Insufficient documentation

## 2022-01-17 DIAGNOSIS — W01198A Fall on same level from slipping, tripping and stumbling with subsequent striking against other object, initial encounter: Secondary | ICD-10-CM | POA: Insufficient documentation

## 2022-01-17 DIAGNOSIS — R519 Headache, unspecified: Secondary | ICD-10-CM | POA: Diagnosis not present

## 2022-01-17 DIAGNOSIS — S0990XA Unspecified injury of head, initial encounter: Secondary | ICD-10-CM | POA: Insufficient documentation

## 2022-01-17 DIAGNOSIS — Z7951 Long term (current) use of inhaled steroids: Secondary | ICD-10-CM | POA: Insufficient documentation

## 2022-01-17 DIAGNOSIS — Z7984 Long term (current) use of oral hypoglycemic drugs: Secondary | ICD-10-CM | POA: Diagnosis not present

## 2022-01-17 DIAGNOSIS — Z79899 Other long term (current) drug therapy: Secondary | ICD-10-CM | POA: Insufficient documentation

## 2022-01-17 LAB — CBC WITH DIFFERENTIAL/PLATELET
Abs Immature Granulocytes: 0.04 10*3/uL (ref 0.00–0.07)
Basophils Absolute: 0.1 10*3/uL (ref 0.0–0.1)
Basophils Relative: 0 %
Eosinophils Absolute: 0.3 10*3/uL (ref 0.0–0.5)
Eosinophils Relative: 2 %
HCT: 41.1 % (ref 36.0–46.0)
Hemoglobin: 13.5 g/dL (ref 12.0–15.0)
Immature Granulocytes: 0 %
Lymphocytes Relative: 37 %
Lymphs Abs: 4.6 10*3/uL — ABNORMAL HIGH (ref 0.7–4.0)
MCH: 29.3 pg (ref 26.0–34.0)
MCHC: 32.8 g/dL (ref 30.0–36.0)
MCV: 89.2 fL (ref 80.0–100.0)
Monocytes Absolute: 0.8 10*3/uL (ref 0.1–1.0)
Monocytes Relative: 7 %
Neutro Abs: 6.5 10*3/uL (ref 1.7–7.7)
Neutrophils Relative %: 54 %
Platelets: 383 10*3/uL (ref 150–400)
RBC: 4.61 MIL/uL (ref 3.87–5.11)
RDW: 13.3 % (ref 11.5–15.5)
WBC: 12.3 10*3/uL — ABNORMAL HIGH (ref 4.0–10.5)
nRBC: 0 % (ref 0.0–0.2)

## 2022-01-17 LAB — MAGNESIUM: Magnesium: 2.3 mg/dL (ref 1.7–2.4)

## 2022-01-17 LAB — COMPREHENSIVE METABOLIC PANEL
ALT: 18 U/L (ref 0–44)
AST: 16 U/L (ref 15–41)
Albumin: 4.3 g/dL (ref 3.5–5.0)
Alkaline Phosphatase: 66 U/L (ref 38–126)
Anion gap: 11 (ref 5–15)
BUN: 18 mg/dL (ref 8–23)
CO2: 23 mmol/L (ref 22–32)
Calcium: 9.7 mg/dL (ref 8.9–10.3)
Chloride: 105 mmol/L (ref 98–111)
Creatinine, Ser: 0.84 mg/dL (ref 0.44–1.00)
GFR, Estimated: >60 mL/min (ref >60)
Glucose, Bld: 112 mg/dL — ABNORMAL HIGH (ref 70–99)
Potassium: 3.6 mmol/L (ref 3.5–5.1)
Sodium: 139 mmol/L (ref 135–145)
Total Bilirubin: 0.2 mg/dL — ABNORMAL LOW (ref 0.3–1.2)
Total Protein: 6.8 g/dL (ref 6.5–8.1)

## 2022-01-17 LAB — LIPASE, BLOOD: Lipase: 37 U/L (ref 11–51)

## 2022-01-17 NOTE — Discharge Instructions (Signed)
You have been seen and discharged from the emergency department.  CT of your head and neck was normal.  Your blood work was normal.  Stay well-hydrated.  You may continue to have concussion-like symptoms for the next couple days.  Follow-up with your primary provider for further evaluation and further care. Take home medications as prescribed. If you have any worsening symptoms or further concerns for your health please return to an emergency department for further evaluation. ?

## 2022-01-17 NOTE — ED Triage Notes (Addendum)
Patient here POV from Home. ? ?Patient endorses having a Large Blind fall on her Head on Saturday. Since then the Patient endorses Headaches, Nausea, Mild to Moderate Photosensitivity. Also endorses Intermittent Tingling Since Sunday on Right Side of Body.  ? ?More recently, the Patient endorses having an uncontrolled Loss of BM and Urine 3 times today.  ? ?No LOC. Headache somewhat responsive to Advil.  ? ?NAD Noted during Triage. A&Ox4. GCS 15. Ambulatory. ?

## 2022-01-17 NOTE — ED Provider Notes (Signed)
?[facility] EMERGENCY DEPT ?Provider Note ? ? ?CSN: [id number] ?Arrival date & time: 01/17/22  1908 ? ?  ? ?History ? ?Chief Complaint  ?Patient presents with  ? Head Injury  ? ? ?Terri Daniel is a 66 y.o. female. ? ?HPI ? ?66 year old female presents emergency department with concern for headache, nausea.  3 days ago patient was hanging a large blind when the side fell off and fell down onto her head.  No loss of consciousness but since then she has had generalized headache, mild nausea.  No vomiting, no neck pain.  In addition today the patient has had some mild abdominal cramping with diarrhea and urgency.  She states 1 time she did make it to the bathroom but this was not true incontinence.  No lower back pain or other signs of spinal injury.  She is otherwise neuro intact with no focal neuro complaints.  Otherwise afebrile and in her baseline health. ? ?Home Medications ?Prior to Admission medications   ?Medication Sig Start Date End Date Taking? Authorizing Provider  ?ACCU-CHEK GUIDE test strip USE DAILY AS DIRECTED 06/20/21   Romero Belling, MD  ?albuterol (PROVENTIL HFA;VENTOLIN HFA) 108 (90 Base) MCG/ACT inhaler Inhale into the lungs every 6 (six) hours as needed for wheezing or shortness of breath.    [provider]  ?atorvastatin (LIPITOR) 10 MG tablet  08/09/21   [provider]  ?bimatoprost (LUMIGAN) 0.01 % SOLN 1 drop into affected eye in the evening    [provider]  ?bromocriptine (PARLODEL) 2.5 MG tablet Take 1 tablet (2.5 mg total) by mouth daily. 05/05/21   Romero Belling, MD  ?bromocriptine (PARLODEL) 2.5 MG tablet 1/4 tablet ?Patient not taking: Reported on 10/05/2021    [provider]  ?buPROPion (WELLBUTRIN XL) 150 MG 24 hr tablet Take 150 mg by mouth daily.    [provider]  ?cetirizine (ZYRTEC) 10 MG tablet Take 10 mg by mouth daily. 04/22/16 01/28/19  [provider]  ?citalopram (CELEXA) 40 MG tablet Take by mouth.  06/21/21   [provider]  ?diclofenac sodium (VOLTAREN) 1 % GEL Place 1 application onto the skin 2 (two) times daily as needed.    [provider]  ?ezetimibe (ZETIA) 10 MG tablet Take 10 mg by mouth daily.    [provider]  ?FLUoxetine (PROZAC) 10 MG capsule Take by mouth. 06/18/21   [provider]  ?fluticasone (FLONASE) 50 MCG/ACT nasal spray Place into both nostrils daily.    [provider]  ?fluticasone (FLONASE) 50 MCG/ACT nasal spray 1 spray in each nostril    [provider]  ?hydrOXYzine (ATARAX/VISTARIL) 25 MG tablet Take 25 mg once as needed by mouth.    [provider]  ?hyoscyamine (LEVBID) 0.375 MG 12 hr tablet 1 tablet    [provider]  ?lisinopril-hydrochlorothiazide (PRINZIDE,ZESTORETIC) 20-12.5 MG tablet Take 2 tablets by mouth daily. 12/13/16   [provider]  ?meloxicam (MOBIC) 15 MG tablet 1 tablet    [provider]  ?metFORMIN (GLUCOPHAGE-XR) 500 MG 24 hr tablet Take 1 tablet (500 mg total) by mouth daily with breakfast. 05/05/21   Romero Belling, MD  ?metFORMIN (GLUCOPHAGE-XR) 500 MG 24 hr tablet 1 tablet with evening meal 06/01/17   [provider]  ?nortriptyline (PAMELOR) 25 MG capsule  06/21/21   [provider]  ?Nutritional Supplements (NUTRITIONAL SUPPLEMENT PO) Take 1,250 mg by mouth.    [provider]  ?omeprazole (PRILOSEC) 40 MG capsule  02/13/17   [provider]  ?omeprazole (PRILOSEC) 40 MG capsule 1 capsule ?Patient not taking: Reported on 10/05/2021 02/13/17   [provider]  ?OXcarbazepine (TRILEPTAL) 150 MG tablet Take by mouth. 06/21/21   [provider]  ?pioglitazone (ACTOS) 15 MG tablet Take 1 tablet (15 mg total) by mouth daily. 05/05/21   Romero BellingEllison, Sean, MD  ?rosuvastatin (CRESTOR) 40 MG tablet Take 40 mg by mouth daily.    [provider]  ?SYSTANE ULTRA 0.4-0.3 % SOLN SMARTSIG:1 Drop(s) In Eye(s) 06/21/21   [provider]  ?tamsulosin (FLOMAX) 0.4 MG CAPS capsule 1 capsule    [provider]  ?tiZANidine (ZANAFLEX) 4 MG tablet  07/23/21   [provider]  ?valACYclovir (VALTREX) 1000 MG tablet Take 1,000 mg by mouth 2 (two) times daily.    [provider]  ?   ? ?Allergies    ?Other, Propoxyphene, Doxycycline, Erythromycin base, Sulfa antibiotics, Contrast media [iodinated contrast media], and Prednisone   ? ?Review of Systems   ?Review of Systems  ?Constitutional:  Positive for fatigue. Negative for fever.  ?Eyes:  Positive for photophobia.  ?Respiratory:  Negative for shortness of breath.   ?Cardiovascular:  Negative for chest pain.  ?Gastrointestinal:  Positive for diarrhea and nausea. Negative for abdominal pain and vomiting.  ?Skin:  Negative for rash.  ?Neurological:  Positive for headaches. Negative for light-headedness.  ? ?Physical Exam ?Updated Vital Signs ?BP (!) 148/80   Pulse (!) 50   Temp 98.3 ?F (36.8 ?C)   Resp 18   Ht 5\' 2"  (1.575 m)   Wt 79.1 kg   SpO2 96%   BMI 31.90 kg/m?  ?Physical Exam ?Vitals and nursing note reviewed.  ?Constitutional:   ?   General: She is not in acute distress. ?   Appearance: Normal appearance.  ?HENT:  ?   Head: Normocephalic.  ?   Mouth/Throat:  ?   Mouth: Mucous membranes are moist.  ?Eyes:  ?   Extraocular Movements: Extraocular movements intact.  ?   Pupils: Pupils are equal, round, and reactive to light.  ?Cardiovascular:  ?   Rate and Rhythm: Normal rate.  ?Pulmonary:  ?   Effort: Pulmonary effort is normal. No respiratory distress.  ?Abdominal:  ?   Palpations: Abdomen is soft.  ?   Tenderness: There is no abdominal tenderness.  ?Musculoskeletal:     ?   General: No tenderness or signs of injury.  ?   Cervical back: No rigidity or tenderness.  ?Skin: ?   General: Skin is warm.  ?Neurological:  ?   General: No focal deficit present.  ?   Mental Status: She is alert and oriented to person, place, and time. Mental status is at baseline.   ?Psychiatric:     ?   Mood and Affect: Mood normal.  ? ? ?ED Results / Procedures / Treatments   ?Labs ?(all labs ordered are listed, but only abnormal results are displayed) ?Labs Reviewed  ?CBC WITH DIFFERENTIAL/PLATELET - Abnormal; Notable for the following components:  ?    Result Value  ? WBC 12.3 (*)   ? Lymphs Abs 4.6 (*)   ? All other components within normal limits  ?COMPREHENSIVE METABOLIC PANEL - Abnormal; Notable for the following components:  ? Glucose, Bld 112 (*)   ? Total Bilirubin 0.2 (*)   ? All other components within normal limits  ?MAGNESIUM  ?LIPASE, BLOOD  ? ? ?EKG ?None ? ?Radiology ?CT Head Wo  Contrast ? ?Result Date: 01/17/2022 ?CLINICAL DATA:  Head injury, headache, nausea EXAM: CT HEAD WITHOUT CONTRAST CT CERVICAL SPINE WITHOUT CONTRAST TECHNIQUE: Multidetector CT imaging of the head and cervical spine was performed following the standard protocol without intravenous contrast. Multiplanar CT image reconstructions of the cervical spine were also generated. RADIATION DOSE REDUCTION: This exam was performed according to the departmental dose-optimization program which includes automated exposure control, adjustment of the mA and/or kV according to patient size and/or use of iterative reconstruction technique. COMPARISON:  None Available. FINDINGS: CT HEAD FINDINGS Brain: No evidence of acute infarction, hemorrhage, hydrocephalus, extra-axial collection or mass lesion/mass effect. Vascular: Intracranial atherosclerosis. Skull: Normal. Negative for fracture or focal lesion. Sinuses/Orbits: The visualized paranasal sinuses are essentially clear. The mastoid air cells are unopacified. Other: None. CT CERVICAL SPINE FINDINGS Alignment: Reversal of the normal cervical lordosis, likely positional. Skull base and vertebrae: No acute fracture. No primary bone lesion or focal pathologic process. Soft tissues and spinal canal: No prevertebral fluid or swelling. No visible canal hematoma. Disc levels:  Very mild degenerative changes at C6-7. Spinal canal is patent. Upper chest: Visualized lung apices are clear. Other: Visualized thyroid is grossly unremarkable. IMPRESSION: Normal head CT. Normal cervical spine CT

## 2022-02-06 DIAGNOSIS — Z6831 Body mass index (BMI) 31.0-31.9, adult: Secondary | ICD-10-CM | POA: Diagnosis not present

## 2022-02-06 DIAGNOSIS — L918 Other hypertrophic disorders of the skin: Secondary | ICD-10-CM | POA: Diagnosis not present

## 2022-02-06 DIAGNOSIS — F32A Depression, unspecified: Secondary | ICD-10-CM | POA: Diagnosis not present

## 2022-02-20 DIAGNOSIS — E119 Type 2 diabetes mellitus without complications: Secondary | ICD-10-CM | POA: Diagnosis not present

## 2022-02-20 DIAGNOSIS — R5383 Other fatigue: Secondary | ICD-10-CM | POA: Diagnosis not present

## 2022-02-21 DIAGNOSIS — R5383 Other fatigue: Secondary | ICD-10-CM | POA: Diagnosis not present

## 2022-03-08 DIAGNOSIS — F3341 Major depressive disorder, recurrent, in partial remission: Secondary | ICD-10-CM | POA: Diagnosis not present

## 2022-03-20 DIAGNOSIS — H02831 Dermatochalasis of right upper eyelid: Secondary | ICD-10-CM | POA: Diagnosis not present

## 2022-03-20 DIAGNOSIS — H02834 Dermatochalasis of left upper eyelid: Secondary | ICD-10-CM | POA: Diagnosis not present

## 2022-03-20 DIAGNOSIS — H401131 Primary open-angle glaucoma, bilateral, mild stage: Secondary | ICD-10-CM | POA: Diagnosis not present

## 2022-03-20 DIAGNOSIS — H16223 Keratoconjunctivitis sicca, not specified as Sjogren's, bilateral: Secondary | ICD-10-CM | POA: Diagnosis not present

## 2022-03-20 DIAGNOSIS — H524 Presbyopia: Secondary | ICD-10-CM | POA: Diagnosis not present

## 2022-04-24 DIAGNOSIS — N3010 Interstitial cystitis (chronic) without hematuria: Secondary | ICD-10-CM | POA: Diagnosis not present

## 2022-04-24 DIAGNOSIS — R3915 Urgency of urination: Secondary | ICD-10-CM | POA: Diagnosis not present

## 2022-04-24 DIAGNOSIS — R351 Nocturia: Secondary | ICD-10-CM | POA: Diagnosis not present

## 2022-04-24 DIAGNOSIS — R350 Frequency of micturition: Secondary | ICD-10-CM | POA: Diagnosis not present

## 2022-04-24 DIAGNOSIS — G588 Other specified mononeuropathies: Secondary | ICD-10-CM | POA: Diagnosis not present

## 2022-04-24 DIAGNOSIS — A6004 Herpesviral vulvovaginitis: Secondary | ICD-10-CM | POA: Diagnosis not present

## 2022-04-24 DIAGNOSIS — N9489 Other specified conditions associated with female genital organs and menstrual cycle: Secondary | ICD-10-CM | POA: Diagnosis not present

## 2022-05-09 DIAGNOSIS — L299 Pruritus, unspecified: Secondary | ICD-10-CM | POA: Diagnosis not present

## 2022-05-23 DIAGNOSIS — M159 Polyosteoarthritis, unspecified: Secondary | ICD-10-CM | POA: Diagnosis not present

## 2022-05-23 DIAGNOSIS — N3011 Interstitial cystitis (chronic) with hematuria: Secondary | ICD-10-CM | POA: Diagnosis not present

## 2022-05-23 DIAGNOSIS — J302 Other seasonal allergic rhinitis: Secondary | ICD-10-CM | POA: Diagnosis not present

## 2022-05-23 DIAGNOSIS — Z0000 Encounter for general adult medical examination without abnormal findings: Secondary | ICD-10-CM | POA: Diagnosis not present

## 2022-05-23 DIAGNOSIS — Z79899 Other long term (current) drug therapy: Secondary | ICD-10-CM | POA: Diagnosis not present

## 2022-05-23 DIAGNOSIS — I10 Essential (primary) hypertension: Secondary | ICD-10-CM | POA: Diagnosis not present

## 2022-05-23 DIAGNOSIS — E1169 Type 2 diabetes mellitus with other specified complication: Secondary | ICD-10-CM | POA: Diagnosis not present

## 2022-05-23 DIAGNOSIS — I700 Atherosclerosis of aorta: Secondary | ICD-10-CM | POA: Diagnosis not present

## 2022-05-23 DIAGNOSIS — M797 Fibromyalgia: Secondary | ICD-10-CM | POA: Diagnosis not present

## 2022-05-23 DIAGNOSIS — E785 Hyperlipidemia, unspecified: Secondary | ICD-10-CM | POA: Diagnosis not present

## 2022-05-23 DIAGNOSIS — E118 Type 2 diabetes mellitus with unspecified complications: Secondary | ICD-10-CM | POA: Diagnosis not present

## 2022-05-25 ENCOUNTER — Other Ambulatory Visit: Payer: Self-pay | Admitting: Internal Medicine

## 2022-05-25 DIAGNOSIS — Z1231 Encounter for screening mammogram for malignant neoplasm of breast: Secondary | ICD-10-CM

## 2022-06-27 ENCOUNTER — Ambulatory Visit
Admission: RE | Admit: 2022-06-27 | Discharge: 2022-06-27 | Disposition: A | Discharge status: Home / Self Care | Payer: Medicare HMO | Source: Ambulatory Visit | Attending: Internal Medicine | Admitting: Internal Medicine

## 2022-06-27 DIAGNOSIS — Z1231 Encounter for screening mammogram for malignant neoplasm of breast: Secondary | ICD-10-CM

## 2022-06-27 LAB — HM MAMMOGRAPHY

## 2022-10-24 LAB — MICROALBUMIN / CREATININE URINE RATIO: Microalb Creat Ratio: 4

## 2022-10-24 LAB — LIPID PANEL
Cholesterol: 218 — AB (ref 0–200)
HDL: 58 (ref 35–70)
LDL Cholesterol: 130
Triglycerides: 161 — AB (ref 40–160)

## 2022-10-24 LAB — PROTEIN / CREATININE RATIO, URINE
Albumin, U: 0.4
Creatinine, Urine: 109

## 2023-01-22 LAB — HEMOGLOBIN A1C: Hemoglobin A1C: 6.4

## 2023-01-22 LAB — LIPID PANEL
Cholesterol: 247 — AB (ref 0–200)
HDL: 64 (ref 35–70)
LDL Cholesterol: 160
Triglycerides: 112 (ref 40–160)

## 2023-03-13 IMAGING — CT CT HEAD W/O CM
4 series · 16 of 47 positions shown, 18 images · non-contrast
Comparison: None Available.

CLINICAL DATA: Head injury, headache, nausea



[Series 2: head bone · axial · 0.41mm/px · z∈[-207,-177]mm · 3 of 77 slices shown]
[im 8/77  bone]
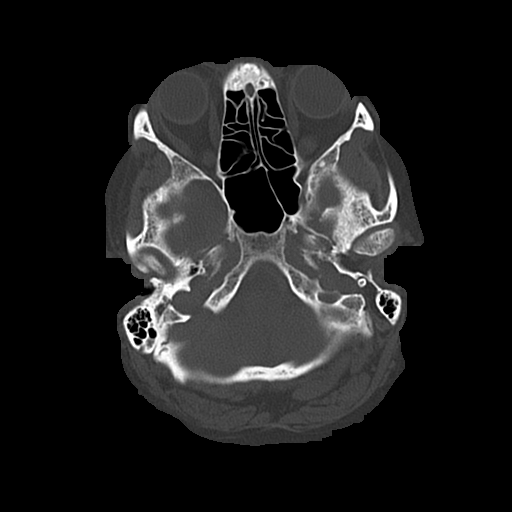
[im 16/77  bone]
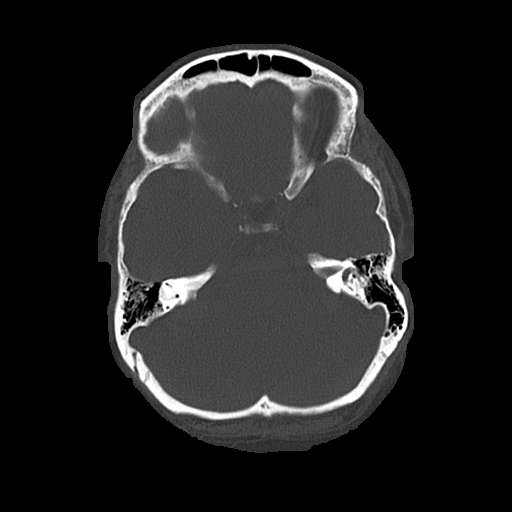
[im 23/77  bone]
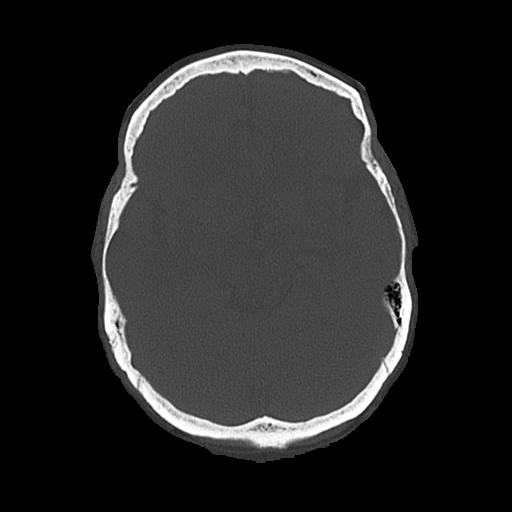

[Series 3: head wo · axial · 0.41mm/px · z∈[-206,-91]mm · 7 of 31 slices shown, 9 images]
[im 4/31  brain]
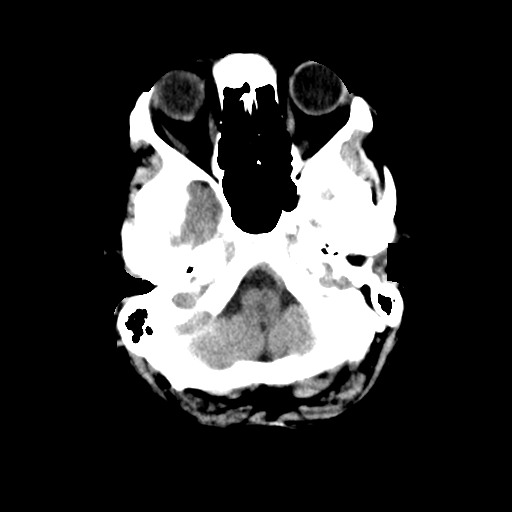
[im 4/31  bone]
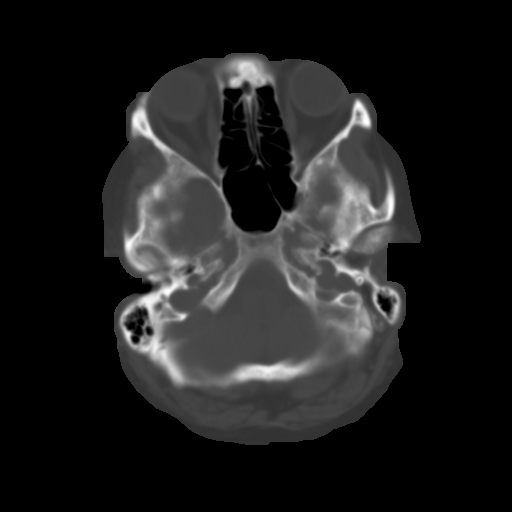
[im 8/31  brain]
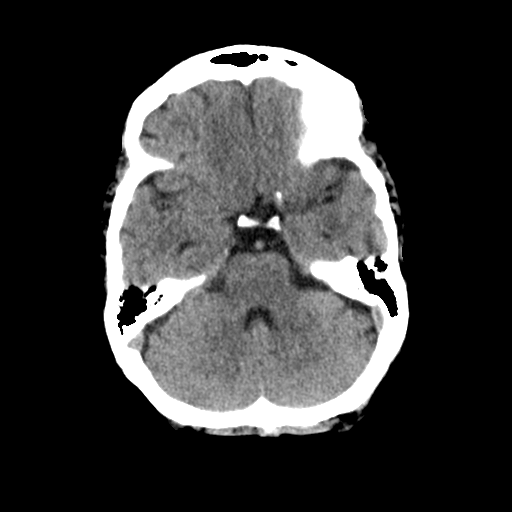
[im 12/31  brain]
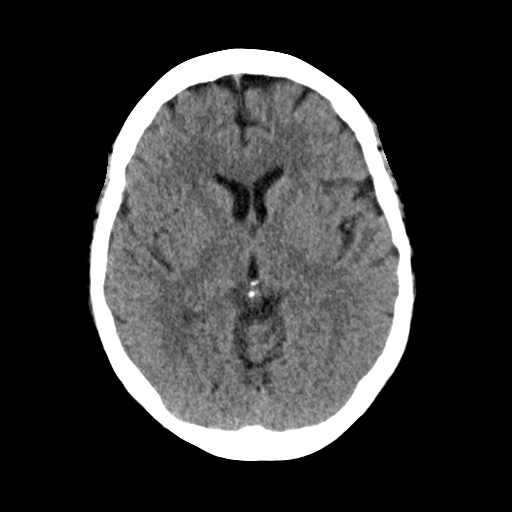
[im 16/31  brain]
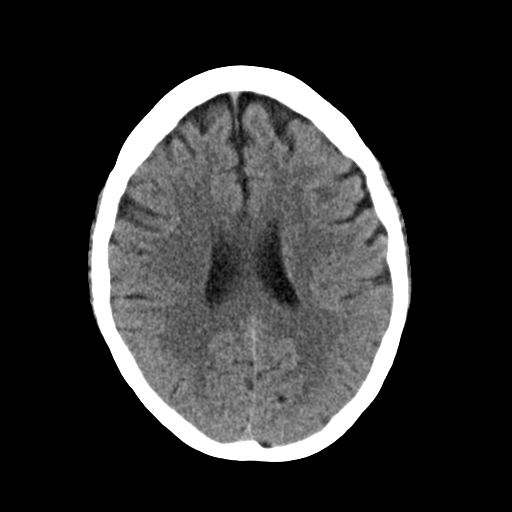
[im 19/31  brain]
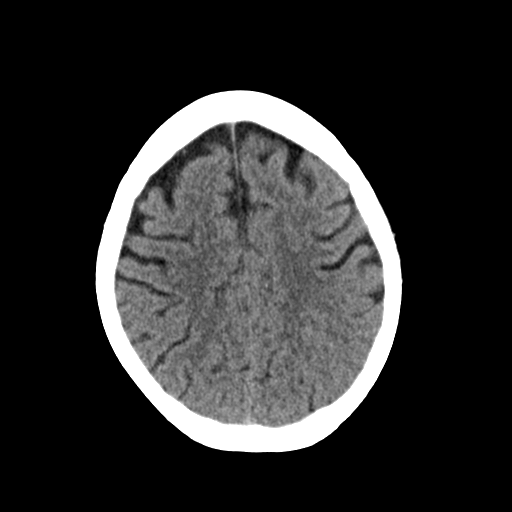
[im 19/31  bone]
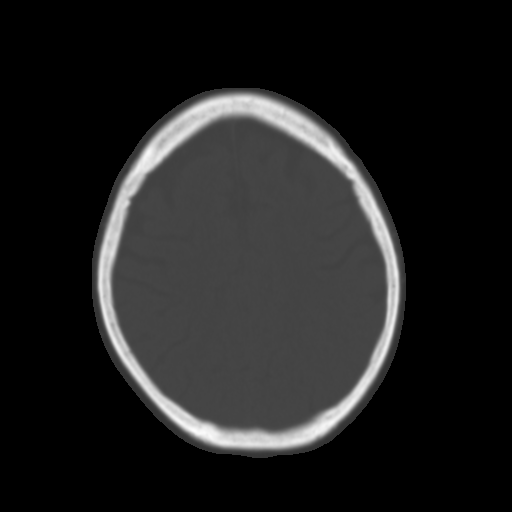
[im 23/31  brain]
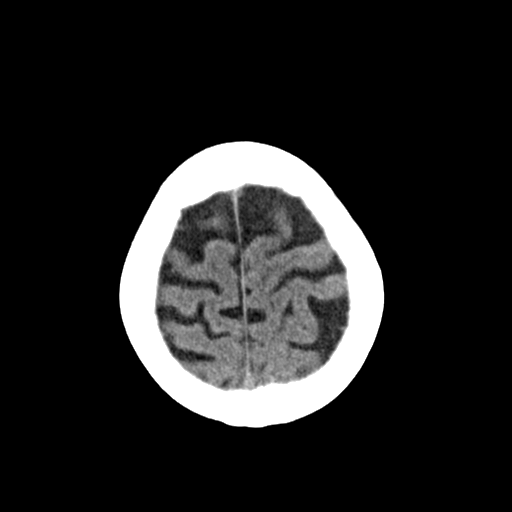
[im 27/31  brain]
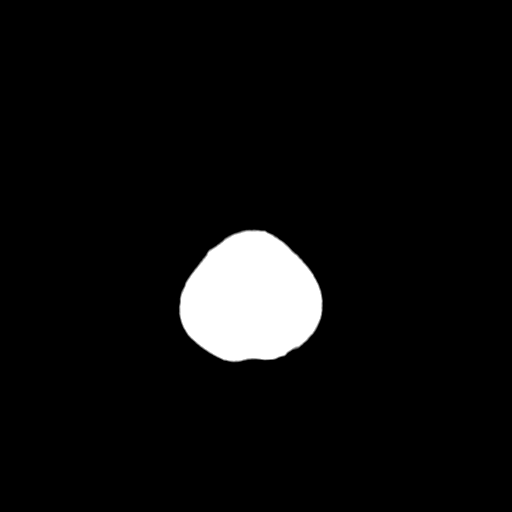

[Series 4: coronal soft · coronal · 0.28mm/px · 3 of 62 slices shown]
[im 21/62  brain]
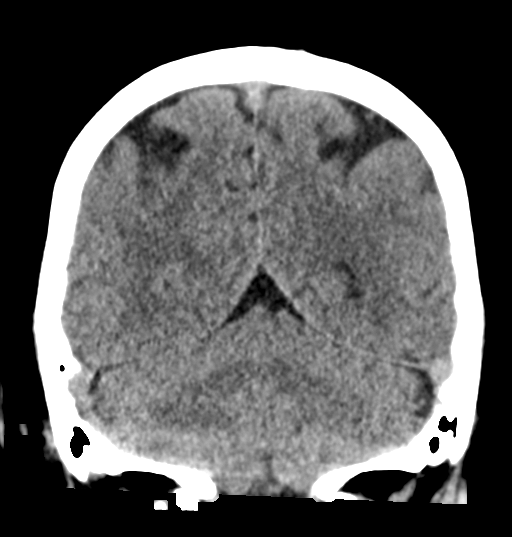
[im 28/62  brain]
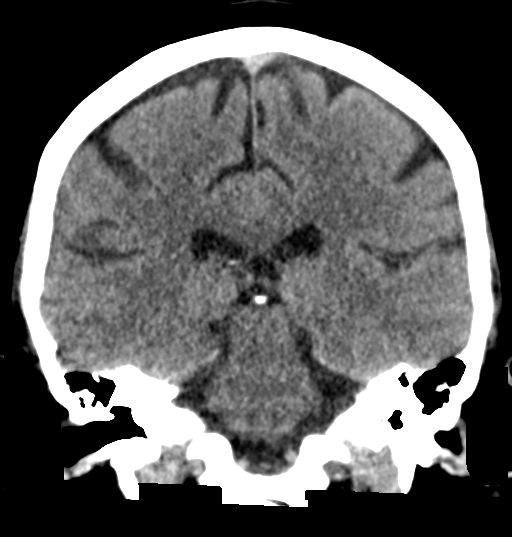
[im 34/62  brain]
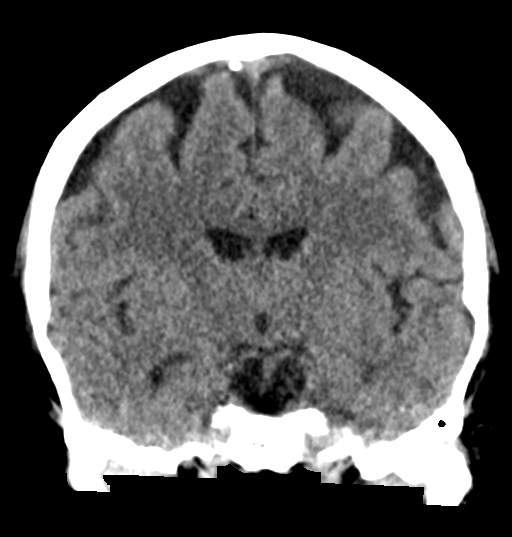

[Series 5: sagittal soft · sagittal · 0.29mm/px · 3 of 49 slices shown]
[im 17/49  brain]
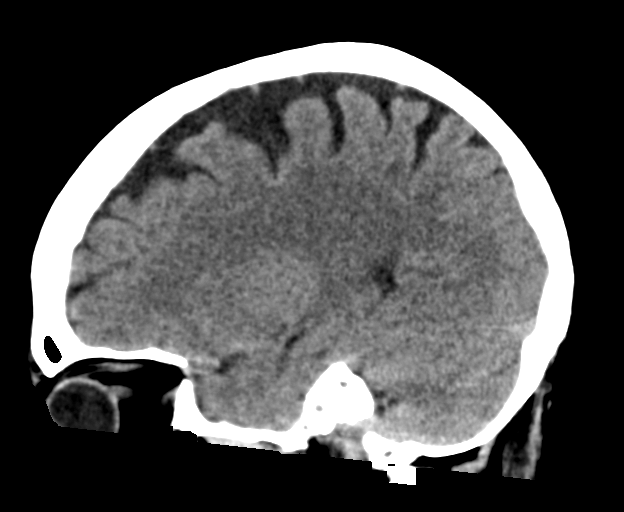
[im 25/49  brain]
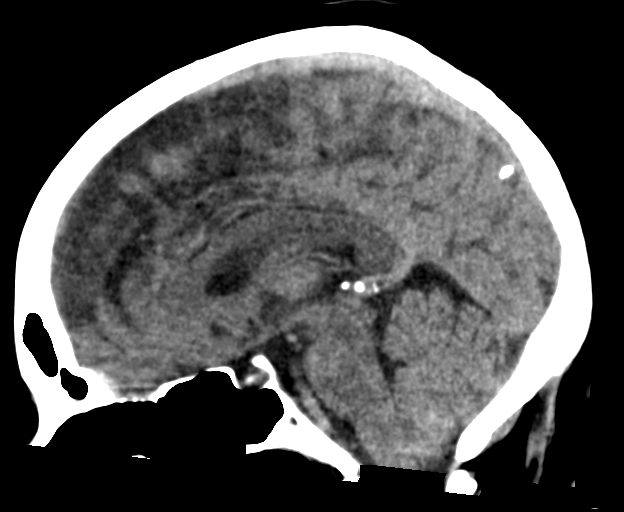
[im 33/49  brain]
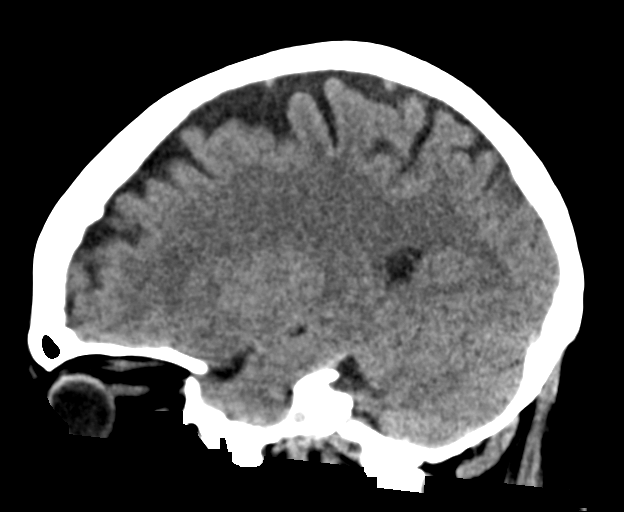

[16 of 47 positions shown; findings below may reference images not displayed]

FINDINGS: CT HEAD FINDINGS

Brain: No evidence of acute infarction, hemorrhage, hydrocephalus,
extra-axial collection or mass lesion/mass effect.

Vascular: Intracranial atherosclerosis.

Skull: Normal. Negative for fracture or focal lesion.

Sinuses/Orbits: The visualized paranasal sinuses are essentially
clear. The mastoid air cells are unopacified.

Other: None.

CT CERVICAL SPINE FINDINGS

Alignment: Reversal of the normal cervical lordosis, likely
positional.

Skull base and vertebrae: No acute fracture. No primary bone lesion
or focal pathologic process.

Soft tissues and spinal canal: No prevertebral fluid or swelling. No
visible canal hematoma.

Disc levels: Very mild degenerative changes at C6-7. Spinal canal is
patent.

Upper chest: Visualized lung apices are clear.

Other: Visualized thyroid is grossly unremarkable.
IMPRESSION: Normal head CT.

Normal cervical spine CT.

## 2023-03-13 IMAGING — CT CT CERVICAL SPINE W/O CM
3 of 4 series · 11 of 33 positions shown, 13 images · non-contrast
Comparison: None Available.

CLINICAL DATA: Head injury, headache, nausea



[Series 5: cor bone · coronal · 0.30mm/px · 3 of 58 slices shown]
[im 12/58  bone]
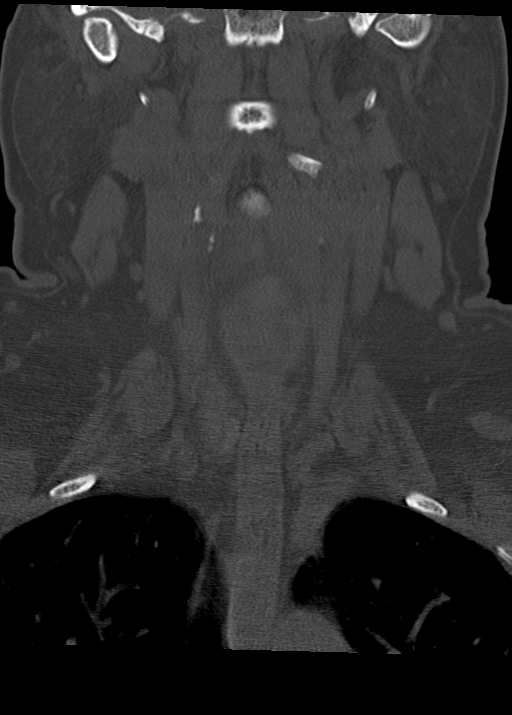
[im 23/58  bone]
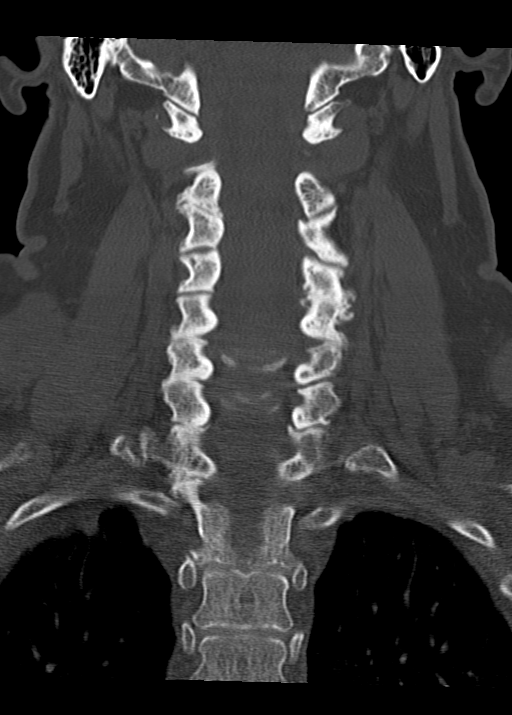
[im 35/58  bone]
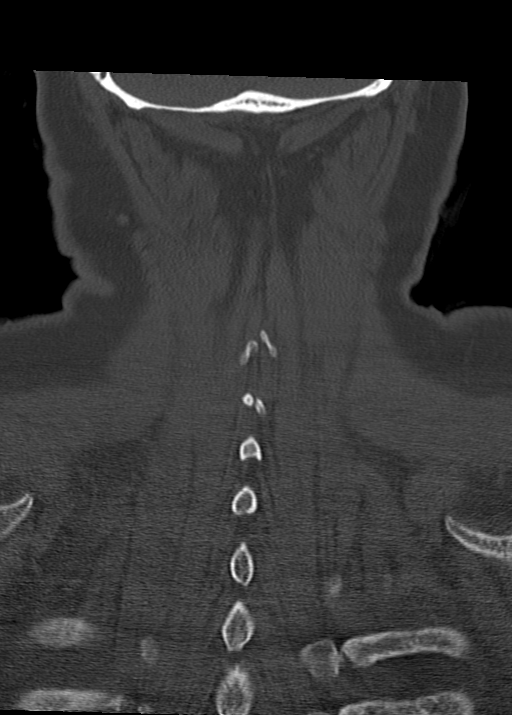

[Series 6: sag bone · sagittal · 0.22mm/px · 5 of 79 slices shown, 6 images]
[im 27/79  bone]
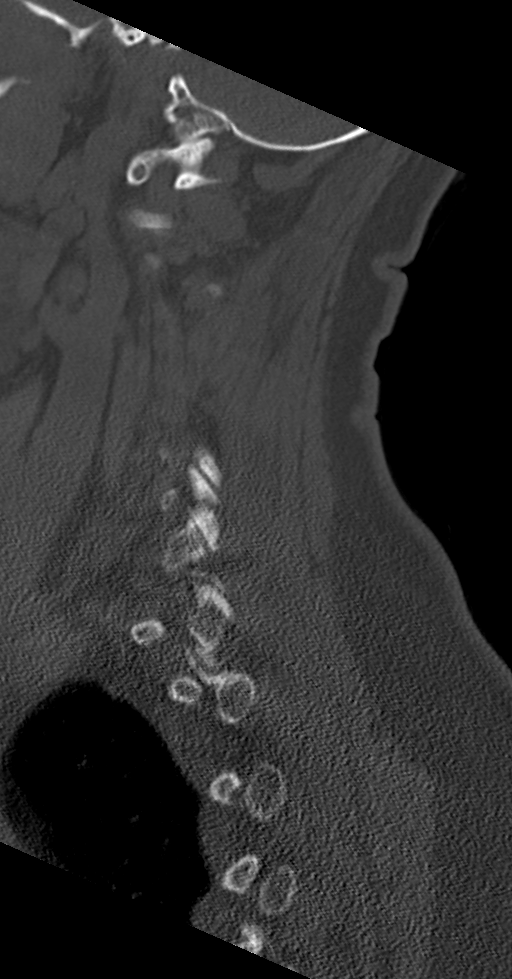
[im 33/79  bone]
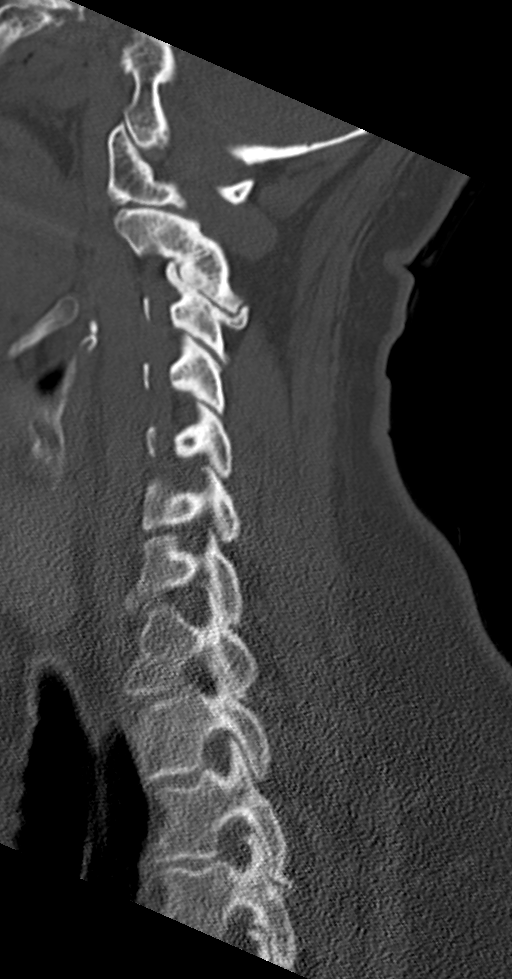
[im 40/79  soft-tissue]
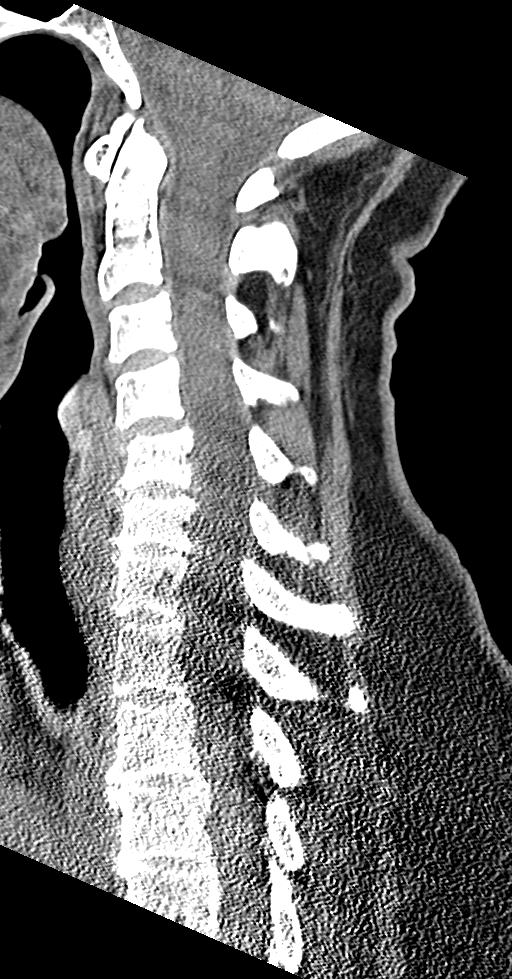
[im 40/79  bone]
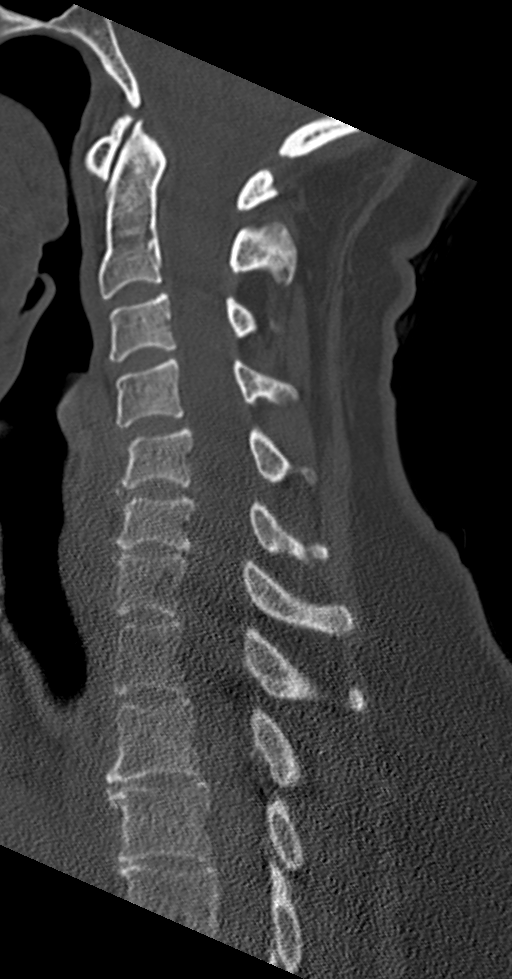
[im 46/79  bone]
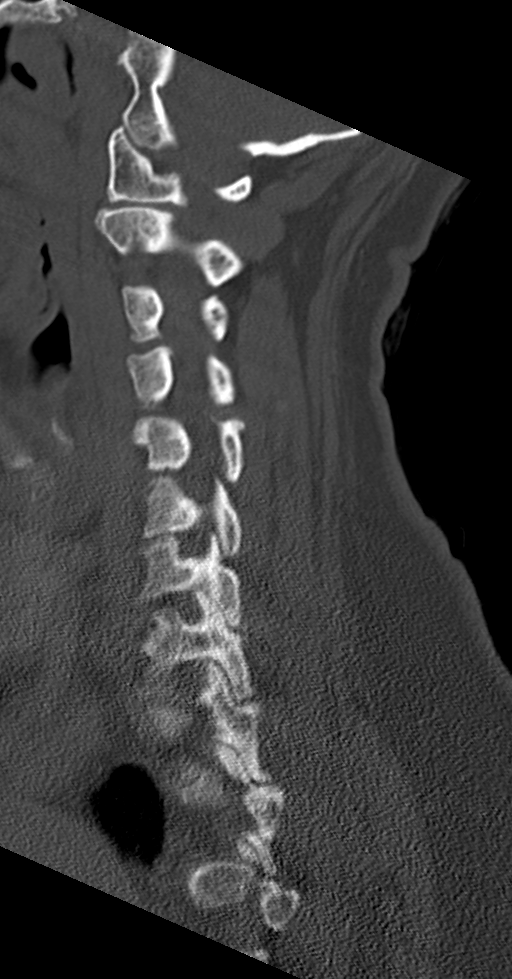
[im 53/79  bone]
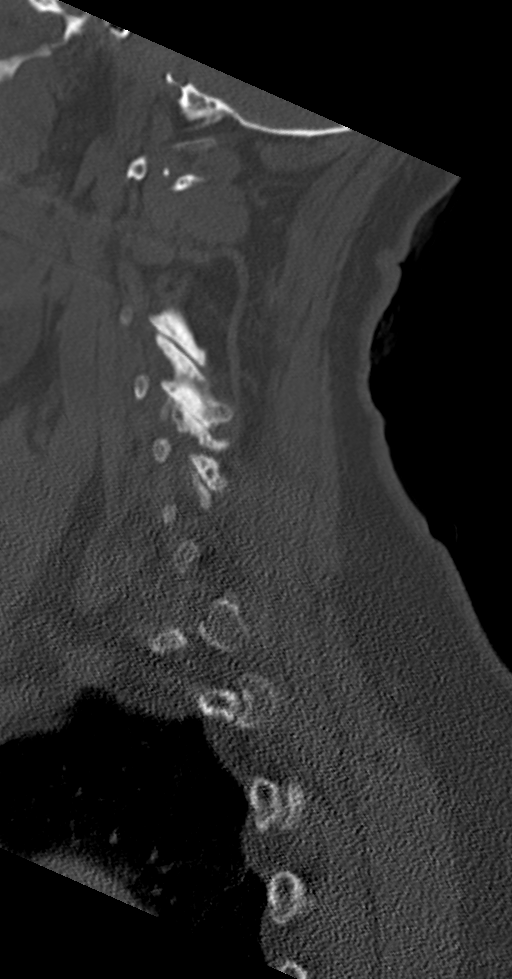

[Series 7: orthogonal axials · axial · 0.21mm/px · z∈[-383,-276]mm · 3 of 101 slices shown, 4 images]
[im 17/101  soft-tissue]
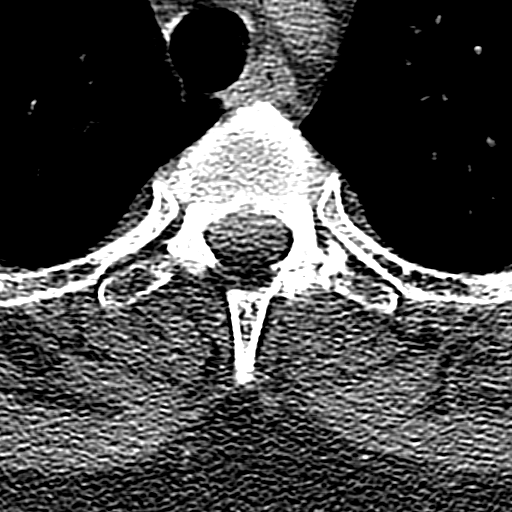
[im 17/101  bone]
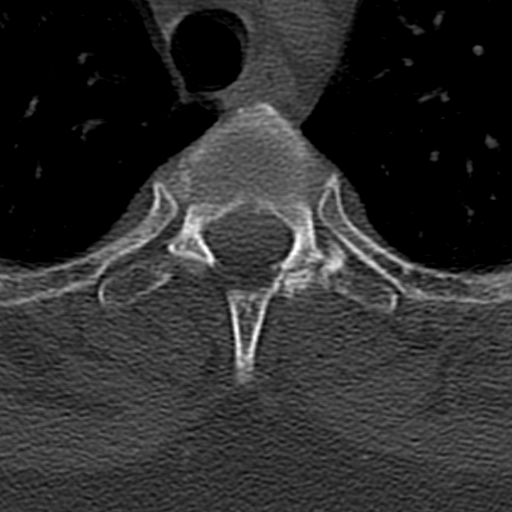
[im 51/101  bone]
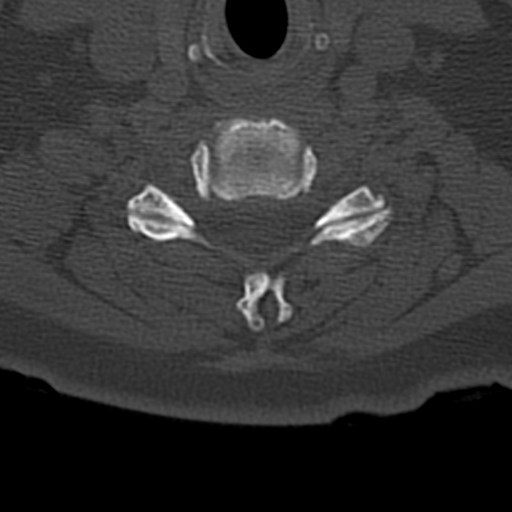
[im 84/101  bone]
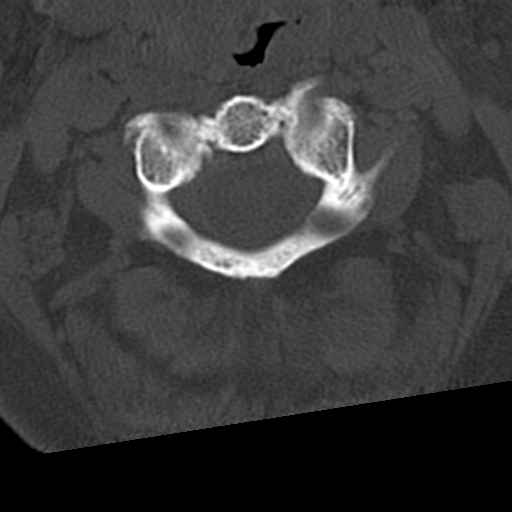

[11 of 33 positions shown; findings below may reference images not displayed]

FINDINGS: CT HEAD FINDINGS

Brain: No evidence of acute infarction, hemorrhage, hydrocephalus,
extra-axial collection or mass lesion/mass effect.

Vascular: Intracranial atherosclerosis.

Skull: Normal. Negative for fracture or focal lesion.

Sinuses/Orbits: The visualized paranasal sinuses are essentially
clear. The mastoid air cells are unopacified.

Other: None.

CT CERVICAL SPINE FINDINGS

Alignment: Reversal of the normal cervical lordosis, likely
positional.

Skull base and vertebrae: No acute fracture. No primary bone lesion
or focal pathologic process.

Soft tissues and spinal canal: No prevertebral fluid or swelling. No
visible canal hematoma.

Disc levels: Very mild degenerative changes at C6-7. Spinal canal is
patent.

Upper chest: Visualized lung apices are clear.

Other: Visualized thyroid is grossly unremarkable.
IMPRESSION: Normal head CT.

Normal cervical spine CT.

## 2023-05-11 ENCOUNTER — Emergency Department (HOSPITAL_BASED_OUTPATIENT_CLINIC_OR_DEPARTMENT_OTHER)
Admission: EM | Admit: 2023-05-11 | Discharge: 2023-05-11 | Disposition: A | Discharge status: Home / Self Care | Payer: Medicare HMO | Attending: Emergency Medicine | Admitting: Emergency Medicine

## 2023-05-11 ENCOUNTER — Emergency Department (HOSPITAL_BASED_OUTPATIENT_CLINIC_OR_DEPARTMENT_OTHER): Payer: Medicare HMO

## 2023-05-11 ENCOUNTER — Other Ambulatory Visit (HOSPITAL_BASED_OUTPATIENT_CLINIC_OR_DEPARTMENT_OTHER): Payer: Self-pay

## 2023-05-11 ENCOUNTER — Encounter (HOSPITAL_BASED_OUTPATIENT_CLINIC_OR_DEPARTMENT_OTHER): Payer: Self-pay | Admitting: Emergency Medicine

## 2023-05-11 ENCOUNTER — Other Ambulatory Visit: Payer: Self-pay

## 2023-05-11 DIAGNOSIS — R0602 Shortness of breath: Secondary | ICD-10-CM | POA: Diagnosis not present

## 2023-05-11 DIAGNOSIS — R079 Chest pain, unspecified: Secondary | ICD-10-CM | POA: Diagnosis not present

## 2023-05-11 DIAGNOSIS — M791 Myalgia, unspecified site: Secondary | ICD-10-CM | POA: Diagnosis not present

## 2023-05-11 DIAGNOSIS — E119 Type 2 diabetes mellitus without complications: Secondary | ICD-10-CM | POA: Diagnosis not present

## 2023-05-11 DIAGNOSIS — R252 Cramp and spasm: Secondary | ICD-10-CM

## 2023-05-11 DIAGNOSIS — M79604 Pain in right leg: Secondary | ICD-10-CM | POA: Diagnosis present

## 2023-05-11 DIAGNOSIS — Z79899 Other long term (current) drug therapy: Secondary | ICD-10-CM | POA: Diagnosis not present

## 2023-05-11 DIAGNOSIS — I10 Essential (primary) hypertension: Secondary | ICD-10-CM | POA: Insufficient documentation

## 2023-05-11 DIAGNOSIS — Z7984 Long term (current) use of oral hypoglycemic drugs: Secondary | ICD-10-CM | POA: Diagnosis not present

## 2023-05-11 LAB — BASIC METABOLIC PANEL
Anion gap: 10 (ref 5–15)
BUN: 20 mg/dL (ref 8–23)
CO2: 25 mmol/L (ref 22–32)
Calcium: 9.5 mg/dL (ref 8.9–10.3)
Chloride: 105 mmol/L (ref 98–111)
Creatinine, Ser: 0.8 mg/dL (ref 0.44–1.00)
GFR, Estimated: >60 mL/min (ref >60)
Glucose, Bld: 118 mg/dL — ABNORMAL HIGH (ref 70–99)
Potassium: 4 mmol/L (ref 3.5–5.1)
Sodium: 140 mmol/L (ref 135–145)

## 2023-05-11 LAB — MAGNESIUM: Magnesium: 2 mg/dL (ref 1.7–2.4)

## 2023-05-11 LAB — HEPATIC FUNCTION PANEL
ALT: 22 U/L (ref 0–44)
AST: 15 U/L (ref 15–41)
Albumin: 4.3 g/dL (ref 3.5–5.0)
Alkaline Phosphatase: 68 U/L (ref 38–126)
Bilirubin, Direct: <0.1 mg/dL (ref 0.0–0.2)
Total Bilirubin: 0.3 mg/dL (ref 0.3–1.2)
Total Protein: 6.7 g/dL (ref 6.5–8.1)

## 2023-05-11 LAB — CBC
HCT: 41.6 % (ref 36.0–46.0)
Hemoglobin: 13.8 g/dL (ref 12.0–15.0)
MCH: 29.7 pg (ref 26.0–34.0)
MCHC: 33.2 g/dL (ref 30.0–36.0)
MCV: 89.7 fL (ref 80.0–100.0)
Platelets: 228 10*3/uL (ref 150–400)
RBC: 4.64 MIL/uL (ref 3.87–5.11)
RDW: 13.2 % (ref 11.5–15.5)
WBC: 10.5 10*3/uL (ref 4.0–10.5)
nRBC: 0 % (ref 0.0–0.2)

## 2023-05-11 LAB — TROPONIN I (HIGH SENSITIVITY)
Troponin I (High Sensitivity): 3 ng/L (ref <18)
Troponin I (High Sensitivity): 4 ng/L (ref <18)

## 2023-05-11 LAB — D-DIMER, QUANTITATIVE: D-Dimer, Quant: <0.27 ug{FEU}/mL (ref 0.00–0.50)

## 2023-05-11 MED ORDER — DIPHENHYDRAMINE HCL 50 MG/ML IJ SOLN
12.5000 mg | Freq: Once | INTRAMUSCULAR | Status: AC
  Administered 2023-05-11: 12.5 mg via INTRAVENOUS
  Filled 2023-05-11: qty 1

## 2023-05-11 MED ORDER — CYCLOBENZAPRINE HCL 5 MG PO TABS
5.0000 mg | ORAL_TABLET | Freq: Once | ORAL | Status: AC
Start: 2023-05-11 — End: 2023-05-11
  Administered 2023-05-11: 5 mg via ORAL
  Filled 2023-05-11: qty 1

## 2023-05-11 MED ORDER — CYCLOBENZAPRINE HCL 5 MG PO TABS
5.0000 mg | ORAL_TABLET | Freq: Three times a day (TID) | ORAL | 0 refills | Status: DC | PRN
  Filled 2023-05-11: qty 30, 10d supply, fill #0

## 2023-05-11 MED ORDER — SODIUM CHLORIDE 0.9 % IV BOLUS
500.0000 mL | Freq: Once | INTRAVENOUS | Status: AC
  Administered 2023-05-11: 500 mL via INTRAVENOUS

## 2023-05-11 MED ORDER — MORPHINE SULFATE (PF) 4 MG/ML IV SOLN
4.0000 mg | Freq: Once | INTRAVENOUS | Status: AC
  Administered 2023-05-11: 4 mg via INTRAVENOUS
  Filled 2023-05-11: qty 1

## 2023-05-11 MED ORDER — METOCLOPRAMIDE HCL 5 MG/ML IJ SOLN
10.0000 mg | Freq: Once | INTRAMUSCULAR | Status: AC
  Administered 2023-05-11: 10 mg via INTRAVENOUS
  Filled 2023-05-11: qty 2

## 2023-05-11 NOTE — ED Triage Notes (Signed)
Pt arrives pov, slow gait with cane, endorse "nerve block" x 2 weeks pta, endorses bilateral leg cramping, RLE radiating from thigh to lower leg. Reports a "deep cramp" prior to new onset of symptoms of feeling n/v, light headed, shob and dizziness last night. Shob has resolved.Also reports RT side CP

## 2023-05-11 NOTE — ED Provider Notes (Signed)
[place] [facility] DEPARTMENT AT [facility] Provider Note   CSN: [id number] Arrival date & time: 05/11/23  0915     History  Chief Complaint  Patient presents with   Leg Pain   Shortness of Breath    Terri Daniel is a 67 y.o. female, history of hypertension, diabetes, hyperlipidemia, fibromyalgia, who presents to the ED secondary to bilateral lower leg pain, has been going on since 3 AM this morning.  He states she states that she had a severe cramp in her right thigh, 3 AM this morning, that woke her up out of her sleep.  She states the cramping was severe, for about 30 minutes, she used heat pads, walking around, and without any relief.  She states that she is concerned she may have a blood clot, she recently had a nerve block, done for her interstitial cystitis, is concerned given the procedure, for a blood clot.  She notes that the pain was so severe that it made her dizzy, and have diarrhea.  She denies any abdominal pain, but does state that she does have a little bit of right-sided chest pain, that is pretty constant, and achy, notes that she also had an episode of shortness of breath after taking her medications around 7:00 today, and this has resolved.  Made her take a few deep breaths, because it took her breath away.  She denies any swelling in 1 leg or the other, use of oral contraceptives or hormone products, or recent trauma.  No history of DVTs, not on any blood thinners.  Home Medications Prior to Admission medications   Medication Sig Start Date End Date Taking? Authorizing Provider  cyclobenzaprine (FLEXERIL) 5 MG tablet Take 1 tablet (5 mg total) by mouth 3 (three) times daily as needed for muscle spasms. 05/11/23  Yes Harrietta Incorvaia L, PA  ACCU-CHEK GUIDE test strip USE DAILY AS DIRECTED 06/20/21   Romero Belling, MD  albuterol (PROVENTIL HFA;VENTOLIN HFA) 108 (90 Base) MCG/ACT inhaler Inhale into the lungs every 6 (six) hours as needed for wheezing or shortness  of breath.    [provider]  atorvastatin (LIPITOR) 10 MG tablet  08/09/21   [provider]  bimatoprost (LUMIGAN) 0.01 % SOLN 1 drop into affected eye in the evening    [provider]  bromocriptine (PARLODEL) 2.5 MG tablet Take 1 tablet (2.5 mg total) by mouth daily. 05/05/21   Romero Belling, MD  bromocriptine (PARLODEL) 2.5 MG tablet 1/4 tablet Patient not taking: Reported on 10/05/2021    [provider]  buPROPion (WELLBUTRIN XL) 150 MG 24 hr tablet Take 150 mg by mouth daily.    [provider]  cetirizine (ZYRTEC) 10 MG tablet Take 10 mg by mouth daily. 04/22/16 01/28/19  [provider]  citalopram (CELEXA) 40 MG tablet Take by mouth. 06/21/21   [provider]  diclofenac sodium (VOLTAREN) 1 % GEL Place 1 application onto the skin 2 (two) times daily as needed.    [provider]  ezetimibe (ZETIA) 10 MG tablet Take 10 mg by mouth daily.    [provider]  FLUoxetine (PROZAC) 10 MG capsule Take by mouth. 06/18/21   [provider]  fluticasone (FLONASE) 50 MCG/ACT nasal spray Place into both nostrils daily.    [provider]  fluticasone (FLONASE) 50 MCG/ACT nasal spray 1 spray in each nostril    [provider]  hydrOXYzine (ATARAX/VISTARIL) 25 MG tablet Take 25 mg once as needed by  mouth.    [provider]  hyoscyamine (LEVBID) 0.375 MG 12 hr tablet 1 tablet    [provider]  lisinopril-hydrochlorothiazide (PRINZIDE,ZESTORETIC) 20-12.5 MG tablet Take 2 tablets by mouth daily. 12/13/16   [provider]  meloxicam (MOBIC) 15 MG tablet 1 tablet    [provider]  metFORMIN (GLUCOPHAGE-XR) 500 MG 24 hr tablet Take 1 tablet (500 mg total) by mouth daily with breakfast. 05/05/21   Romero Belling, MD  metFORMIN (GLUCOPHAGE-XR) 500 MG 24 hr tablet 1 tablet with evening meal 06/01/17   [provider]  nortriptyline (PAMELOR) 25 MG capsule   06/21/21   [provider]  Nutritional Supplements (NUTRITIONAL SUPPLEMENT PO) Take 1,250 mg by mouth.    [provider]  omeprazole (PRILOSEC) 40 MG capsule  02/13/17   [provider]  omeprazole (PRILOSEC) 40 MG capsule 1 capsule Patient not taking: Reported on 10/05/2021 02/13/17   [provider]  OXcarbazepine (TRILEPTAL) 150 MG tablet Take by mouth. 06/21/21   [provider]  pioglitazone (ACTOS) 15 MG tablet Take 1 tablet (15 mg total) by mouth daily. 05/05/21   Romero Belling, MD  rosuvastatin (CRESTOR) 40 MG tablet Take 40 mg by mouth daily.    [provider]  SYSTANE ULTRA 0.4-0.3 % SOLN SMARTSIG:1 Drop(s) In Eye(s) 06/21/21   [provider]  tamsulosin (FLOMAX) 0.4 MG CAPS capsule 1 capsule    [provider]  tiZANidine (ZANAFLEX) 4 MG tablet  07/23/21   [provider]  valACYclovir (VALTREX) 1000 MG tablet Take 1,000 mg by mouth 2 (two) times daily.    [provider]      Allergies    Other, Propoxyphene, Doxycycline, Erythromycin base, Sulfa antibiotics, Contrast media [iodinated contrast media], and Prednisone    Review of Systems   Review of Systems  Respiratory:  Positive for shortness of breath.   Cardiovascular:  Positive for chest pain. Negative for leg swelling.    Physical Exam Updated Vital Signs BP (!) 141/82   Pulse (!) 59   Temp 98.1 F (36.7 C)   Resp 12   SpO2 97%  Physical Exam Vitals and nursing note reviewed.  Constitutional:      General: She is not in acute distress.    Appearance: She is well-developed.  HENT:     Head: Normocephalic and atraumatic.  Eyes:     Conjunctiva/sclera: Conjunctivae normal.  Cardiovascular:     Rate and Rhythm: Normal rate and regular rhythm.     Heart sounds: No murmur heard. Pulmonary:     Effort: Pulmonary effort is normal. No respiratory distress.     Breath sounds: Normal breath sounds.  Abdominal:     Palpations:  Abdomen is soft.     Tenderness: There is no abdominal tenderness.  Musculoskeletal:        General: No swelling.     Cervical back: Neck supple.     Comments: Tenderness to palpation of inner right thigh, as well as posterior thigh.  No popliteal tenderness to palpation. + chest wall tenderness to palpation of the right side of chest wall.  No ecchymosis, rashes at the sites.  Range of motion intact.  Positive dorsalis pedis pulses.  Good strength of bilateral lower extremities.  5 out of 5.  No swelling noted.  Skin:    General: Skin is warm and dry.     Capillary Refill: Capillary refill takes less than 2 seconds.  Neurological:  Mental Status: She is alert.  Psychiatric:        Mood and Affect: Mood normal.     ED Results / Procedures / Treatments   Labs (all labs ordered are listed, but only abnormal results are displayed) Labs Reviewed  BASIC METABOLIC PANEL - Abnormal; Notable for the following components:      Result Value   Glucose, Bld 118 (*)    All other components within normal limits  CBC  HEPATIC FUNCTION PANEL  MAGNESIUM  D-DIMER, QUANTITATIVE (NOT AT [facility])  TROPONIN I (HIGH SENSITIVITY)  TROPONIN I (HIGH SENSITIVITY)    EKG EKG Interpretation Date/Time:  Friday May 11 2023 09:49:31 EDT Ventricular Rate:  71 PR Interval:  164 QRS Duration:  82 QT Interval:  384 QTC Calculation: 417 R Axis:   -15  Text Interpretation: Normal sinus rhythm Low voltage QRS Cannot rule out Anteroseptal infarct , age undetermined Abnormal ECG When compared with ECG of 06-Feb-2017 11:40, PREVIOUS ECG IS PRESENT when compared to prior, similar appearance. No STEMI Confirmed by Theda Belfast ([id number]) on 05/11/2023 11:07:42 AM  Radiology US Venous Img Lower Bilateral  Addendum Date: 05/11/2023   ADDENDUM REPORT: 05/11/2023 12:58 ADDENDUM: Typographical error in the original report.  Correction below. IMPRESSION: No evidence of bilateral lower extremity DVT. Electronically  Signed   By: Karen Kays M.D.   On: 05/11/2023 12:58   Result Date: 05/11/2023 CLINICAL DATA:  Cramping leg pain EXAM: Bilateral LOWER EXTREMITY VENOUS DOPPLER ULTRASOUND TECHNIQUE: Gray-scale sonography with compression, as well as color and duplex ultrasound, were performed to evaluate the deep venous system(s) from the level of the common femoral vein through the popliteal and proximal calf veins. COMPARISON:  None Available. FINDINGS: VENOUS Normal compressibility of the common femoral, superficial femoral, and popliteal veins, as well as the visualized calf veins. Visualized portions of profunda femoral vein and great saphenous vein unremarkable. No filling defects to suggest DVT on grayscale or color Doppler imaging. Doppler waveforms show normal direction of venous flow, normal respiratory plasticity and response to augmentation. 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 Limited views of the contralateral common femoral vein are unremarkable. OTHER None. Limitations: none IMPRESSION: No evidence of left lower extremity DVT. Electronically Signed: By: Karen Kays M.D. On: 05/11/2023 12:50   DG Chest Port 1 View  Result Date: 05/11/2023 CLINICAL DATA:  Chest pain EXAM: PORTABLE CHEST 1 VIEW COMPARISON:  X-ray 07/03/2018 and older FINDINGS: Hyperinflation. No consolidation, pneumothorax or effusion. No edema. Normal cardiopericardial silhouette. Calcified aorta. Degenerative changes are seen of the spine IMPRESSION: No acute cardiopulmonary disease. Electronically Signed   By: Karen Kays M.D.   On: 05/11/2023 11:14    Procedures Procedures    Medications Ordered in ED Medications  morphine (PF) 4 MG/ML injection 4 mg (4 mg Intravenous Given 05/11/23 1132)  cyclobenzaprine (FLEXERIL) tablet 5 mg (5 mg Oral Given 05/11/23 1058)  metoCLOPramide (REGLAN) injection 10 mg (10 mg Intravenous Given 05/11/23 1241)  diphenhydrAMINE (BENADRYL) injection 12.5 mg (12.5 mg Intravenous  Given 05/11/23 1241)  sodium chloride 0.9 % bolus 500 mL (500 mLs Intravenous New Bag/Given 05/11/23 1243)    ED Course/ Medical Decision Making/ A&P                                 Medical Decision Making Patient is a 67 year old female, here for bilateral leg cramps, and shortness of breath and  chest pain, has been going on for the last day.  She did have a procedure done about 2 weeks ago, and she is concerned she may have blood clot.  She does have cramping in the right thigh especially, we will obtain ultrasound of bilateral legs, to rule out DVT, as well as a dimer, to rule out a PE given the chest pain, shortness of breath.  Additionally we will obtain troponins given chest pain.  I am suspicious that her chest pain, shortness of breath may be secondary to anxiety, however is unclear at this time.  Additionally her cramping of her legs may be secondary to the recent injection, versus muscle cramps  Amount and/or Complexity of Data Reviewed Labs: ordered.    Details: Magnesium normal limits, troponin negative, D-dimer less than 0.27 Radiology: ordered.    Details: Ultrasound shows no DVTs in bilateral lower extremities, chest x-ray clear ECG/medicine tests:  Decision-making details documented in ED Course. Discussion of management or test interpretation with external provider(s): Discussed with patient, she is moderate risk Wells criteria, D-dimer is negative, we will hold off on CTA at this time.  Ultrasound showed no DVTs in bilateral lower extremities.  Her troponin is negative.  We discussed return precautions, and follow-up with PCP.  She is feeling much better, now, has no complaints.  Did develop a headache after the morphine, but this is resolved with medications.  We discussed return precautions and she voiced understanding  Risk Prescription drug management.    Final Clinical Impression(s) / ED Diagnoses Final diagnoses:  Muscle cramps  Chest pain, unspecified type  Shortness  of breath    Rx / DC Orders ED Discharge Orders          Ordered    cyclobenzaprine (FLEXERIL) 5 MG tablet  3 times daily PRN        05/11/23 1325              Alithea Lapage, [place], PA 05/11/23 1329    Tegeler, Canary Brim, MD 05/11/23 [phone]

## 2023-05-11 NOTE — Discharge Instructions (Signed)
Please follow-up with your primary care doctor, your chest pain workup, as well as your ultrasounds were unremarkable.  I think that your pain may be secondary to muscle cramps, versus the pain from the nerve block that you received a couple weeks ago.  Additionally make sure you are resting, and using warm water,/hot bath, to help with your muscle cramps.  Your electrolytes are within normal limits, thus we do not need to add anything to your regimen.  If you have worsening chest pain, shortness of breath, or leg cramps please return to the ER.

## 2023-06-14 LAB — CBC: RBC: 4.29 (ref 3.87–5.11)

## 2023-06-14 LAB — LIPID PANEL
Cholesterol: 237 — AB (ref 0–200)
HDL: 62 (ref 35–70)
LDL Cholesterol: 126
Triglycerides: 342 — AB (ref 40–160)

## 2023-06-14 LAB — HEMOGLOBIN A1C: Hemoglobin A1C: 7.2

## 2023-06-14 LAB — CBC AND DIFFERENTIAL
HCT: 38 (ref 36–46)
Hemoglobin: 13.1 (ref 12.0–16.0)
Platelets: 336 10*3/uL (ref 150–400)
WBC: 11.1

## 2023-06-14 LAB — COMPREHENSIVE METABOLIC PANEL WITH GFR
Globulin: 2.4
eGFR: 96

## 2023-06-14 LAB — BASIC METABOLIC PANEL WITH GFR
CO2: 22 (ref 13–22)
Chloride: 105 (ref 99–108)
Creatinine: 0.7 (ref 0.5–1.1)
Glucose: 196
Potassium: 4.2 meq/L (ref 3.5–5.1)
Sodium: 138 (ref 137–147)

## 2023-06-14 LAB — HEPATIC FUNCTION PANEL
ALT: 23 U/L (ref 7–35)
AST: 15 (ref 13–35)
Alkaline Phosphatase: 83 (ref 25–125)
Bilirubin, Total: 0.2

## 2023-07-10 ENCOUNTER — Ambulatory Visit: Payer: Medicare HMO | Admitting: Cardiovascular Disease

## 2023-08-07 ENCOUNTER — Other Ambulatory Visit: Payer: Self-pay | Admitting: Geriatric Medicine

## 2023-08-07 DIAGNOSIS — Z1231 Encounter for screening mammogram for malignant neoplasm of breast: Secondary | ICD-10-CM

## 2023-09-05 HISTORY — PX: EYE SURGERY: SHX253

## 2023-09-06 DIAGNOSIS — Z1231 Encounter for screening mammogram for malignant neoplasm of breast: Secondary | ICD-10-CM

## 2023-09-26 LAB — LIPID PANEL
Cholesterol: 159 (ref 0–200)
HDL: 69 (ref 35–70)
LDL Cholesterol: 73
Triglycerides: 89 (ref 40–160)

## 2023-09-26 LAB — CBC AND DIFFERENTIAL
HCT: 40 (ref 36–46)
Hemoglobin: 13.4 (ref 12.0–16.0)
Platelets: 328 10*3/uL (ref 150–400)
WBC: 9.6

## 2023-09-26 LAB — PROTEIN / CREATININE RATIO, URINE
Albumin, U: 0.2
Creatinine, Urine: 33

## 2023-09-26 LAB — CBC: RBC: 4.32 (ref 3.87–5.11)

## 2023-09-26 LAB — HEMOGLOBIN A1C: Hemoglobin A1C: 8.8

## 2023-09-26 LAB — MICROALBUMIN / CREATININE URINE RATIO: Microalb Creat Ratio: 6

## 2023-12-04 HISTORY — PX: EYE SURGERY: SHX253

## 2023-12-11 ENCOUNTER — Emergency Department (HOSPITAL_BASED_OUTPATIENT_CLINIC_OR_DEPARTMENT_OTHER): Admitting: Radiology

## 2023-12-11 ENCOUNTER — Encounter (HOSPITAL_BASED_OUTPATIENT_CLINIC_OR_DEPARTMENT_OTHER): Payer: Self-pay

## 2023-12-11 ENCOUNTER — Emergency Department (HOSPITAL_BASED_OUTPATIENT_CLINIC_OR_DEPARTMENT_OTHER)
Admission: EM | Admit: 2023-12-11 | Discharge: 2023-12-11 | Disposition: A | Discharge status: Home / Self Care | Attending: Emergency Medicine | Admitting: Emergency Medicine

## 2023-12-11 ENCOUNTER — Other Ambulatory Visit: Payer: Self-pay

## 2023-12-11 DIAGNOSIS — Z79899 Other long term (current) drug therapy: Secondary | ICD-10-CM | POA: Diagnosis not present

## 2023-12-11 DIAGNOSIS — I10 Essential (primary) hypertension: Secondary | ICD-10-CM | POA: Insufficient documentation

## 2023-12-11 DIAGNOSIS — F17200 Nicotine dependence, unspecified, uncomplicated: Secondary | ICD-10-CM | POA: Insufficient documentation

## 2023-12-11 DIAGNOSIS — R079 Chest pain, unspecified: Secondary | ICD-10-CM | POA: Diagnosis present

## 2023-12-11 DIAGNOSIS — J45909 Unspecified asthma, uncomplicated: Secondary | ICD-10-CM | POA: Insufficient documentation

## 2023-12-11 DIAGNOSIS — Z7951 Long term (current) use of inhaled steroids: Secondary | ICD-10-CM | POA: Insufficient documentation

## 2023-12-11 DIAGNOSIS — Z7984 Long term (current) use of oral hypoglycemic drugs: Secondary | ICD-10-CM | POA: Diagnosis not present

## 2023-12-11 DIAGNOSIS — E119 Type 2 diabetes mellitus without complications: Secondary | ICD-10-CM | POA: Insufficient documentation

## 2023-12-11 LAB — CBC
HCT: 38.6 % (ref 36.0–46.0)
Hemoglobin: 13.3 g/dL (ref 12.0–15.0)
MCH: 30.2 pg (ref 26.0–34.0)
MCHC: 34.5 g/dL (ref 30.0–36.0)
MCV: 87.5 fL (ref 80.0–100.0)
Platelets: 434 10*3/uL — ABNORMAL HIGH (ref 150–400)
RBC: 4.41 MIL/uL (ref 3.87–5.11)
RDW: 13.3 % (ref 11.5–15.5)
WBC: 12.7 10*3/uL — ABNORMAL HIGH (ref 4.0–10.5)
nRBC: 0 % (ref 0.0–0.2)

## 2023-12-11 LAB — BASIC METABOLIC PANEL WITH GFR
Anion gap: 12 (ref 5–15)
BUN: 11 mg/dL (ref 8–23)
CO2: 23 mmol/L (ref 22–32)
Calcium: 9.8 mg/dL (ref 8.9–10.3)
Chloride: 100 mmol/L (ref 98–111)
Creatinine, Ser: 0.71 mg/dL (ref 0.44–1.00)
GFR, Estimated: >60 mL/min (ref >60)
Glucose, Bld: 132 mg/dL — ABNORMAL HIGH (ref 70–99)
Potassium: 3.5 mmol/L (ref 3.5–5.1)
Sodium: 135 mmol/L (ref 135–145)

## 2023-12-11 LAB — TROPONIN I (HIGH SENSITIVITY)
Troponin I (High Sensitivity): 4 ng/L (ref <18)
Troponin I (High Sensitivity): 4 ng/L (ref <18)

## 2023-12-11 LAB — D-DIMER, QUANTITATIVE: D-Dimer, Quant: 0.33 ug{FEU}/mL (ref 0.00–0.50)

## 2023-12-11 MED ORDER — HYDROCORTISONE SOD SUC (PF) 100 MG IJ SOLR
200.0000 mg | Freq: Once | INTRAMUSCULAR | Status: DC
  Filled 2023-12-11: qty 4

## 2023-12-11 NOTE — ED Provider Notes (Signed)
 [place] EMERGENCY DEPARTMENT AT [facility] Provider Note   CSN: [id number] Arrival date & time: 12/11/23  1557     History {Add pertinent medical, surgical, social history, OB history to HPI:1} Chief Complaint  Patient presents with   Chest Pain    Terri Daniel is a 68 y.o. female.  HPI     68 year old female with a history of hypertension, hyperlipidemia, diabetes, chronic interstitial cystitis, asthma who presents with concern for chest pain.   Pain began as pressure at 1130PM last night, then had sharp pain to back, shredding feeling to left arm. Drank water, walked around, tried omeprazole then went back to sleep. Woke up this morning and thought maybe better but it seemed to hurt with deep breath and has had the same pain, jaw pain, headache.  3-4 times had feeling like going to have BM/urinate on self. Then felt exhausted.  Neck pain.  Not worse with exertion.  Sitting at desk and would be there. Have fibromyalgia, and at first thought it was from that.  Not had pain like that before.  Not worse with movement.  Pressure greater with deep breaths, comes and goes.   No leg pain or swelling.  No fever or cough.  Diaphoresis this AM that resolved. no nausea or vomiting.  Chills.  No focal numbness/weakness.   =brother was diagnosed in 36s with MI, had another MI 3 years ago has stents.  Mom and dad both started having heart issues in 22s.  Smoke.  No alcohol or other drugs.    Past Medical History:  Diagnosis Date   Anxiety    Arthritis    Asthma    Bulging lumbar disc    Chronic interstitial cystitis    Depression    Diabetes mellitus without complication (HCC)    Disorder of left rotator cuff    Fibromyalgia    GERD (gastroesophageal reflux disease)    Glaucoma    HLD (hyperlipidemia)    Hypertension    Hypertensive retinopathy    Tobacco abuse    Transaminitis      Home Medications Prior to Admission medications   Medication Sig Start Date End  Date Taking? Authorizing Provider  ACCU-CHEK GUIDE test strip USE DAILY AS DIRECTED 06/20/21   Romero Belling, MD  albuterol (PROVENTIL HFA;VENTOLIN HFA) 108 (90 Base) MCG/ACT inhaler Inhale into the lungs every 6 (six) hours as needed for wheezing or shortness of breath.    [provider]  atorvastatin (LIPITOR) 10 MG tablet  08/09/21   [provider]  bimatoprost (LUMIGAN) 0.01 % SOLN 1 drop into affected eye in the evening    [provider]  bromocriptine (PARLODEL) 2.5 MG tablet Take 1 tablet (2.5 mg total) by mouth daily. 05/05/21   Romero Belling, MD  bromocriptine (PARLODEL) 2.5 MG tablet 1/4 tablet Patient not taking: Reported on 10/05/2021    [provider]  buPROPion (WELLBUTRIN XL) 150 MG 24 hr tablet Take 150 mg by mouth daily.    [provider]  cetirizine (ZYRTEC) 10 MG tablet Take 10 mg by mouth daily. 04/22/16 01/28/19  [provider]  citalopram (CELEXA) 40 MG tablet Take by mouth. 06/21/21   [provider]  cyclobenzaprine (FLEXERIL) 5 MG tablet Take 1 tablet (5 mg total) by mouth 3 (three) times daily as needed for muscle spasms. 05/11/23   Small, Brooke L, PA  diclofenac sodium (VOLTAREN) 1 % GEL Place 1 application onto the skin 2 (two) times daily  as needed.    [provider]  ezetimibe (ZETIA) 10 MG tablet Take 10 mg by mouth daily.    [provider]  FLUoxetine (PROZAC) 10 MG capsule Take by mouth. 06/18/21   [provider]  fluticasone (FLONASE) 50 MCG/ACT nasal spray Place into both nostrils daily.    [provider]  fluticasone (FLONASE) 50 MCG/ACT nasal spray 1 spray in each nostril    [provider]  hydrOXYzine (ATARAX/VISTARIL) 25 MG tablet Take 25 mg once as needed by mouth.    [provider]  hyoscyamine (LEVBID) 0.375 MG 12 hr tablet 1 tablet    [provider]  lisinopril-hydrochlorothiazide (PRINZIDE,ZESTORETIC) 20-12.5 MG tablet Take 2  tablets by mouth daily. 12/13/16   [provider]  meloxicam (MOBIC) 15 MG tablet 1 tablet    [provider]  metFORMIN (GLUCOPHAGE-XR) 500 MG 24 hr tablet Take 1 tablet (500 mg total) by mouth daily with breakfast. 05/05/21   Romero Belling, MD  metFORMIN (GLUCOPHAGE-XR) 500 MG 24 hr tablet 1 tablet with evening meal 06/01/17   [provider]  nortriptyline (PAMELOR) 25 MG capsule  06/21/21   [provider]  Nutritional Supplements (NUTRITIONAL SUPPLEMENT PO) Take 1,250 mg by mouth.    [provider]  omeprazole (PRILOSEC) 40 MG capsule  02/13/17   [provider]  omeprazole (PRILOSEC) 40 MG capsule 1 capsule Patient not taking: Reported on 10/05/2021 02/13/17   [provider]  OXcarbazepine (TRILEPTAL) 150 MG tablet Take by mouth. 06/21/21   [provider]  pioglitazone (ACTOS) 15 MG tablet Take 1 tablet (15 mg total) by mouth daily. 05/05/21   Romero Belling, MD  rosuvastatin (CRESTOR) 40 MG tablet Take 40 mg by mouth daily.    [provider]  SYSTANE ULTRA 0.4-0.3 % SOLN SMARTSIG:1 Drop(s) In Eye(s) 06/21/21   [provider]  tamsulosin (FLOMAX) 0.4 MG CAPS capsule 1 capsule    [provider]  tiZANidine (ZANAFLEX) 4 MG tablet  07/23/21   [provider]  valACYclovir (VALTREX) 1000 MG tablet Take 1,000 mg by mouth 2 (two) times daily.    [provider]      Allergies    Other, Propoxyphene, Doxycycline, Erythromycin base, Sulfa antibiotics, Contrast media [iodinated contrast media], and Prednisone    Review of Systems   Review of Systems  Physical Exam Updated Vital Signs BP (!) 141/79 (BP Location: Left Arm)   Pulse 82   Temp 98.5 F (36.9 C) (Oral)   Resp 20   Ht 5\' 2"  (1.575 m)   Wt 71.7 kg   SpO2 95%   BMI 28.90 kg/m  Physical Exam Vitals and nursing note reviewed.  Constitutional:      General: She is not in acute distress.    Appearance: She is  well-developed. She is not diaphoretic.  HENT:     Head: Normocephalic and atraumatic.  Eyes:     Conjunctiva/sclera: Conjunctivae normal.  Cardiovascular:     Rate and Rhythm: Normal rate and regular rhythm.     Heart sounds: Normal heart sounds. No murmur heard.    No friction rub. No gallop.  Pulmonary:     Effort: Pulmonary effort is normal. No respiratory distress.     Breath sounds: Normal breath sounds. No wheezing or rales.  Abdominal:     General: There is no distension.     Palpations: Abdomen is soft.     Tenderness: There is no abdominal tenderness. There  is no guarding.  Musculoskeletal:        General: No tenderness.     Cervical back: Normal range of motion.  Skin:    General: Skin is warm and dry.     Findings: No erythema or rash.  Neurological:     Mental Status: She is alert and oriented to person, place, and time.     ED Results / Procedures / Treatments   Labs (all labs ordered are listed, but only abnormal results are displayed) Labs Reviewed  BASIC METABOLIC PANEL WITH GFR - Abnormal; Notable for the following components:      Result Value   Glucose, Bld 132 (*)    All other components within normal limits  CBC - Abnormal; Notable for the following components:   WBC 12.7 (*)    Platelets 434 (*)    All other components within normal limits  TROPONIN I (HIGH SENSITIVITY)    EKG None  Radiology No results found.  Procedures Procedures  {Document cardiac monitor, telemetry assessment procedure when appropriate:1}  Medications Ordered in ED Medications - No data to display  ED Course/ Medical Decision Making/ A&P   {   Click here for ABCD2, HEART and other calculatorsREFRESH Note before signing :1}                              Medical Decision Making Amount and/or Complexity of Data Reviewed Labs: ordered. Radiology: ordered.   ***  {Document critical care time when appropriate:1} {Document review of labs and clinical decision  tools ie heart score, Chads2Vasc2 etc:1}  {Document your independent review of radiology images, and any outside records:1} {Document your discussion with family members, caretakers, and with consultants:1} {Document social determinants of health affecting pt's care:1} {Document your decision making why or why not admission, treatments were needed:1} Final Clinical Impression(s) / ED Diagnoses Final diagnoses:  None    Rx / DC Orders ED Discharge Orders     None

## 2023-12-11 NOTE — ED Triage Notes (Signed)
 Patient arrives ambulatory to the ED with complaints of chest pressure going into her jaw & back that started yesterday. Rates her pain a 6/10.

## 2024-02-06 LAB — HM DIABETES EYE EXAM

## 2024-02-07 ENCOUNTER — Ambulatory Visit: Admitting: Family

## 2024-02-07 ENCOUNTER — Encounter: Payer: Self-pay | Admitting: Family

## 2024-02-07 VITALS — BP 120/76 | HR 70 | Temp 97.8°F | Ht 62.0 in | Wt 157.4 lb

## 2024-02-07 DIAGNOSIS — E1169 Type 2 diabetes mellitus with other specified complication: Secondary | ICD-10-CM

## 2024-02-07 DIAGNOSIS — Z1159 Encounter for screening for other viral diseases: Secondary | ICD-10-CM

## 2024-02-07 DIAGNOSIS — N3010 Interstitial cystitis (chronic) without hematuria: Secondary | ICD-10-CM

## 2024-02-07 DIAGNOSIS — E782 Mixed hyperlipidemia: Secondary | ICD-10-CM

## 2024-02-07 DIAGNOSIS — J4530 Mild persistent asthma, uncomplicated: Secondary | ICD-10-CM | POA: Diagnosis not present

## 2024-02-07 DIAGNOSIS — E669 Obesity, unspecified: Secondary | ICD-10-CM

## 2024-02-07 DIAGNOSIS — I10 Essential (primary) hypertension: Secondary | ICD-10-CM | POA: Diagnosis not present

## 2024-02-07 DIAGNOSIS — M797 Fibromyalgia: Secondary | ICD-10-CM

## 2024-02-07 DIAGNOSIS — K580 Irritable bowel syndrome with diarrhea: Secondary | ICD-10-CM

## 2024-02-07 DIAGNOSIS — F325 Major depressive disorder, single episode, in full remission: Secondary | ICD-10-CM

## 2024-02-07 DIAGNOSIS — E2839 Other primary ovarian failure: Secondary | ICD-10-CM

## 2024-02-07 NOTE — Progress Notes (Addendum)
 Provider: Christean Courts FNP-C   Hero Kulish, Elijio Guadeloupe, NP  Patient Care Team: Emanie Behan, Elijio Guadeloupe, NP as PCP - General (Family Medicine)  Extended Emergency Contact Information Primary Emergency Contact: [place]ju[place] [address] Home Phone: [phone] Mobile Phone: [phone] Relation: Sister Secondary Emergency Contact: Gery Kubas, [place] Phone: [phone] Relation: Daughter  Code Status:  Full Code  Goals of care: Advanced Directive information    05/11/2023    9:50 AM  Advanced Directives  Does Patient Have a Medical Advance Directive? No     Chief Complaint  Patient presents with   New Patient (Initial Visit)    New patient , establish care Discuss blood work  Had labs in OCT A1c was 10-11.8 Due to have A1C checked in April , clinic closed and was unable to have it done pt has started on new medication      Discussed the use of AI scribe software for clinical note transcription with the patient, who gave verbal consent to proceed.  History of Present Illness   Terri Daniel is a 68 year old female who presents to establish care.  She has a long-standing history of interstitial cystitis, managed for over twenty years under the care of Dr. Lavona Pounds at [facility] now [facility]. Her condition is well-controlled.  She experiences fibromyalgia, which worsens with rainy weather. Medications help manage her symptoms, though they are exacerbated today due to the weather.  She has diabetes, which worsened in October following a traumatic event leading to depression and anxiety. Her A1c levels increased, prompting a change in her medication regimen. She started Mounjaro in February at 2.5 mg, increased to 5 mg after two months, and is currently on 7.5 mg. Her blood sugar levels have improved significantly, now consistently around 96-97 mg/dL, and she has experienced weight loss. She walks two miles daily  as part of her exercise routine.  She has high blood pressure and cholesterol, monitored by her primary care provider. She cannot take statins due to severe leg cramps. Her current medications for blood pressure include hydrochlorothiazide and lisinopril at 20 mg and 12.5 mg respectively, and amlodipine at 2.5 mg.  She requires a right knee replacement but is not planning surgery soon. She has had a left knee replacement in the past and is currently experiencing right knee pain. She also has shoulder issues, with torn rotator cuffs in both shoulders, and plans to have reverse shoulder surgery on the right side this summer under the care of Dr. Deeann Fare.  She has situational asthma, triggered by environments such as indoor pools and exposure to heavy perfumes. She rarely uses her inhaler.  She has irritable bowel syndrome, primarily presenting with diarrhea. She manages flares with hyoscyamine and dietary modifications.  She has chronic fatigue syndrome.  She has a history of depression, currently stable under psychiatric care and medication. No current issues with depression are reported.  Her current medications include seasonal Zyrtec for allergies, Voltaren gel for knee pain, omeprazole for acid reflux, hydroxyzine 25 mg as needed for bladder issues, albuterol as needed, Flonase for severe allergies, meloxicam 15 mg, tizanidine 4 mg, and Systane eye drops. She also takes calcium , magnesium, a multivitamin, and vitamin C supplements.  She lives alone and is able to take care of herself, although she occasionally assists her mother who has cancer. She is looking forward  to the possibility of her daughter having a baby, as she currently does not see her other grandchildren.  No current issues with incontinence, stable depression, and no recent asthma attacks. She experienced a fall in March but reports no ongoing issues. No blood in the stool and no recent use of her inhaler.    Past Medical  History:  Diagnosis Date   Anxiety    Arthritis    Asthma    Bulging lumbar disc    Chronic interstitial cystitis    Depression    Diabetes mellitus without complication (HCC)    Disorder of left rotator cuff    Fibromyalgia    GERD (gastroesophageal reflux disease)    Glaucoma    HLD (hyperlipidemia)    Hypertension    Hypertensive retinopathy    Tobacco abuse    Transaminitis    Past Surgical History:  Procedure Laterality Date   ABDOMINAL HYSTERECTOMY     BREAST BIOPSY Left    BREAST CYST EXCISION Right    CATARACT EXTRACTION     CYSTO     CYSTOSCOPY     EYE SURGERY  12/2023   EYE SURGERY Left 09/2023   JOINT REPLACEMENT Left 2009   Knee   LEFT HEART CATH AND CORONARY ANGIOGRAPHY N/A 02/07/2017   Procedure: Left Heart Cath and Coronary Angiography;  Surgeon: Millicent Ally, MD;  Location: [facility] INVASIVE CV LAB;  Service: Cardiovascular;  Laterality: N/A;   TUBAL LIGATION      Allergies  Allergen Reactions   Other Shortness Of Breath   Propoxyphene Shortness Of Breath   Doxycycline Nausea And Vomiting   Erythromycin Base Rash   Sulfa Antibiotics Rash   Contrast Media [Iodinated Contrast Media] Hives    CT in 2004   Prednisone     Heart pounding, rash     Allergies as of 02/07/2024       Reactions   Other Shortness Of Breath   Propoxyphene Shortness Of Breath   Doxycycline Nausea And Vomiting   Erythromycin Base Rash   Sulfa Antibiotics Rash   Contrast Media [iodinated Contrast Media] Hives   CT in 2004   Prednisone    Heart pounding, rash        Medication List        Accurate as of February 07, 2024  9:30 PM. If you have any questions, ask your nurse or doctor.          STOP taking these medications    atorvastatin 10 MG tablet Commonly known as: LIPITOR Stopped by: Maliik Karner C Helina Hullum   bromocriptine  2.5 MG tablet Commonly known as: PARLODEL  Stopped by: Carly Applegate C Mylan Schwarz   buPROPion 150 MG 24 hr tablet Commonly known as: WELLBUTRIN  XL Stopped by: Charleston Vierling C Ermagene Saidi   citalopram 40 MG tablet Commonly known as: CELEXA Stopped by: Janan Bogie C Dalan Cowger   cyclobenzaprine  5 MG tablet Commonly known as: FLEXERIL  Stopped by: Sorrel Cassetta C Demetrion Wesby   ezetimibe 10 MG tablet Commonly known as: ZETIA Stopped by: Micalah Cabezas C Dell Briner   FLUoxetine 10 MG capsule Commonly known as: PROZAC Stopped by: Ericah Scotto C Alberto Schoch   metFORMIN  500 MG 24 hr tablet Commonly known as: GLUCOPHAGE -XR Stopped by: Meri Pelot C Karsten Vaughn   nortriptyline 25 MG capsule Commonly known as: PAMELOR Stopped by: Starling Jessie C Tondra Reierson   NUTRITIONAL SUPPLEMENT PO Stopped by: Elijio Guadeloupe Sanita Estrada   pioglitazone  15 MG tablet Commonly known as: ACTOS  Stopped by: Elijio Guadeloupe Laine Fonner   rosuvastatin  40 MG tablet Commonly  known as: CRESTOR  Stopped by: Elijio Guadeloupe Desiderio Dolata       TAKE these medications    Accu-Chek Guide test strip Generic drug: glucose blood USE DAILY AS DIRECTED   albuterol 108 (90 Base) MCG/ACT inhaler Commonly known as: VENTOLIN HFA Inhale into the lungs every 6 (six) hours as needed for wheezing or shortness of breath.   amLODipine 2.5 MG tablet Commonly known as: NORVASC Take 2.5 mg by mouth daily.   cetirizine 10 MG tablet Commonly known as: ZYRTEC Take 10 mg by mouth daily.   diclofenac sodium 1 % Gel Commonly known as: VOLTAREN Place 1 application onto the skin 2 (two) times daily as needed.   fluticasone 50 MCG/ACT nasal spray Commonly known as: FLONASE Place into both nostrils daily.   fluticasone 50 MCG/ACT nasal spray Commonly known as: FLONASE 1 spray in each nostril   hydrOXYzine 25 MG tablet Commonly known as: ATARAX Take 25 mg once as needed by mouth.   hyoscyamine 0.375 MG 12 hr tablet Commonly known as: LEVBID 1 tablet   lisinopril-hydrochlorothiazide 20-12.5 MG tablet Commonly known as: ZESTORETIC Take 2 tablets by mouth daily.   Lumigan 0.01 % Soln Generic drug: bimatoprost 1 drop into affected eye in the evening    meloxicam 15 MG tablet Commonly known as: MOBIC 1 tablet   omeprazole 40 MG capsule Commonly known as: PRILOSEC   omeprazole 40 MG capsule Commonly known as: PRILOSEC   OXcarbazepine 150 MG tablet Commonly known as: TRILEPTAL Take by mouth.   Systane Ultra 0.4-0.3 % Soln Generic drug: Polyethyl Glycol-Propyl Glycol SMARTSIG:1 Drop(s) In Eye(s)   tamsulosin 0.4 MG Caps capsule Commonly known as: FLOMAX 1 capsule   tiZANidine 4 MG tablet Commonly known as: ZANAFLEX   valACYclovir 1000 MG tablet Commonly known as: VALTREX Take 1,000 mg by mouth 2 (two) times daily.        Review of Systems  Constitutional:  Positive for fatigue. Negative for appetite change, chills, fever and unexpected weight change.  HENT:  Negative for congestion, ear discharge, ear pain, hearing loss, nosebleeds, postnasal drip, rhinorrhea, sinus pressure, sinus pain, sneezing, sore throat, tinnitus and trouble swallowing.   Eyes:  Negative for pain, discharge, redness, itching and visual disturbance.  Respiratory:  Negative for cough, chest tightness, shortness of breath and wheezing.   Cardiovascular:  Negative for chest pain, palpitations and leg swelling.  Gastrointestinal:  Positive for diarrhea. Negative for abdominal distention, abdominal pain, blood in stool, constipation, nausea and vomiting.       Acid reflux  IBS with diarrhea   Endocrine: Negative for cold intolerance, heat intolerance, polydipsia, polyphagia and polyuria.  Genitourinary:  Negative for difficulty urinating, dysuria, flank pain, frequency and urgency.       Chronic cystitis   Musculoskeletal:  Positive for arthralgias. Negative for back pain, gait problem, joint swelling, myalgias, neck pain and neck stiffness.       Right knee pain  Bilateral shoulder pain   Skin:  Negative for color change, pallor, rash and wound.  Neurological:  Negative for dizziness, syncope, speech difficulty, weakness, light-headedness, numbness  and headaches.  Hematological:  Does not bruise/bleed easily.  Psychiatric/Behavioral:  Negative for agitation, behavioral problems, confusion, hallucinations, self-injury, sleep disturbance and suicidal ideas. The patient is not nervous/anxious.     Immunization History  Administered Date(s) Administered   Influenza-Unspecified 07/17/2010   Pneumococcal Polysaccharide-23 07/17/2010   Td 02/01/2006   Tdap 10/21/2015   Pertinent  Health Maintenance Due  Topic Date Due  Colonoscopy  Never done   DEXA SCAN  Never done   FOOT EXAM  05/05/2022   HEMOGLOBIN A1C  03/25/2024   INFLUENZA VACCINE  04/04/2024   MAMMOGRAM  06/27/2024   OPHTHALMOLOGY EXAM  02/05/2025      02/15/2017    8:56 AM 01/17/2022    8:00 PM 02/07/2024    3:07 PM  Fall Risk  Falls in the past year? Yes  1  Was there an injury with Fall? Yes  1  Was there an injury with Fall? - Comments   Pt was very sore  Fall Risk Category Calculator   2  (RETIRED) Patient Fall Risk Level  Low fall risk   Patient at Risk for Falls Due to Other (Comment)  Impaired balance/gait  Fall risk Follow up Falls evaluation completed;Education provided;Falls prevention discussed  Falls evaluation completed   Functional Status Survey:    Vitals:   02/07/24 1512  BP: 120/76  Pulse: 70  Temp: 97.8 F (36.6 C)  TempSrc: Temporal  SpO2: 97%  Weight: 157 lb 6.4 oz (71.4 kg)  Height: 5\' 2"  (1.575 m)   Body mass index is 28.79 kg/m. Physical Exam GENERAL: Alert, cooperative, well developed, no acute distress. HEENT: Normocephalic, normal oropharynx, moist mucous membranes, tympanic membranes normal, no cerumen, nose normal. NECK: Thyroid  normal. CHEST: Clear to auscultation bilaterally, no wheezes, rhonchi, or crackles. CARDIOVASCULAR: Normal heart rate and rhythm, S1 and S2 normal without murmurs, peripheral pulses strong. ABDOMEN: Soft, non-tender, non-distended, without organomegaly, normal bowel sounds. EXTREMITIES: No cyanosis or  edema, no tenderness in legs. NEUROLOGICAL: Cranial nerves grossly intact, moves all extremities without gross motor or sensory deficit, sensation intact in feet. SKIN: Skin bruises easily.  PSYCHIATRY/BEHAVIORAL: Mood stable    Labs reviewed: Recent Labs    05/11/23 0958 06/14/23 0000 12/11/23 1624  NA 140 138 135  K 4.0 4.2 3.5  CL 105 105 100  CO2 25 22 23   GLUCOSE 118*  --  132*  BUN 20  --  11  CREATININE 0.80 0.7 0.71  CALCIUM  9.5  --  9.8  MG 2.0  --   --    Recent Labs    05/11/23 0958 06/14/23 0000  AST 15 15  ALT 22 23  ALKPHOS 68 83  BILITOT 0.3  --   PROT 6.7  --   ALBUMIN 4.3  --    Recent Labs    05/11/23 0958 06/14/23 0000 09/26/23 0000 12/11/23 1624  WBC 10.5 11.1 9.6 12.7*  HGB 13.8 13.1 13.4 13.3  HCT 41.6 38 40 38.6  MCV 89.7  --   --  87.5  PLT 228 336 328 434*   Lab Results  Component Value Date   TSH 1.72 06/18/2020   Lab Results  Component Value Date   HGBA1C 8.8 09/26/2023   Lab Results  Component Value Date   CHOL 159 09/26/2023   HDL 69 09/26/2023   LDLCALC 73 09/26/2023   LDLDIRECT 182.0 06/18/2020   TRIG 89 09/26/2023   CHOLHDL 5 06/18/2020    Significant Diagnostic Results in last 30 days:  No results found.  Assessment/Plan  Diabetes Mellitus Diabetes is well-controlled with Mounjaro, with blood glucose levels around 96-97 mg/dL. She has been on Mounjaro since February, starting at 2.5 mg and currently on 7.5 mg, resulting in weight loss and improved glycemic control. As a comfort eater, she finds Mounjaro helpful in managing food cravings. - Continue Mounjaro 7.5 mg - Monitor blood glucose levels regularly -  Encourage daily walking exercise  Hypertension Hypertension is managed with hydrochlorothiazide, lisinopril, and amlodipine 2.5 mg. - Continue hydrochlorothiazide, lisinopril, and amlodipine 2.5 mg  Hyperlipidemia She cannot tolerate statins due to severe myalgia. Cholesterol management is through diet  and exercise, with regular monitoring to assess lifestyle modification effectiveness. - Monitor cholesterol levels every three months  Fibromyalgia Fibromyalgia symptoms exacerbate on rainy days. Current medications provide symptom relief. - Continue current medications for fibromyalgia  Chronic Fatigue Syndrome Chronic fatigue syndrome is present and associated with fibromyalgia.  Asthma She has situational asthma triggered by indoor pools and heavy perfumes, with infrequent inhaler use. - Use albuterol inhaler as needed  Irritable Bowel Syndrome She experiences episodes of diarrhea, managing flares with hyoscyamine and dietary modifications. - Continue hyoscyamine as needed - Maintain dietary modifications to prevent flares  Depression Depression is well-managed with psychiatric care and medication. She is under the care of a psychiatrist and counselor. - Continue psychiatric follow-up and counseling  Right Knee Osteoarthritis She requires a right knee replacement but is delaying surgery. She experiences knee pain and uses Voltaren gel for relief. - Continue using Voltaren gel for knee pain - Follow up with orthopedic specialist as needed  Bilateral Shoulder Rotator Cuff Tears She has bilateral rotator cuff tears with significant arthritis, requiring reverse shoulder replacement starting with the right shoulder. Surgery is planned with Dr. Deeann Fare this summer. - Plan for right shoulder surgery with Dr. Deeann Fare this summer  Interstitial Cystitis Interstitial cystitis is well-controlled under Dr. Lavona Pounds. She performs bladder installations during flares and prefers urinalysis at her urology office due to potential misinterpretation of hematuria by non-specialists. - Continue management with Dr. Lavona Pounds - Perform bladder installations as needed  General Health Maintenance She is due for a pneumonia vaccine and is considering it. She is overdue for a mammogram and bone  density scan. She had a colonoscopy in 2020 with no polyps and is due in 2030. She is not interested in the shingles vaccine. - Schedule mammogram and bone density scan - Discuss pneumonia vaccine - Schedule Medicare wellness visit - Perform hepatitis C screening - Perform microalbumin urine test for diabetes  Follow-up Regular follo w-up is essential for managing her chronic conditions. - Schedule fasting lab work for cholesterol, CBC, chemistry, A1c, lipid panel, thyroid , and microalbumin - Schedule follow-up appointments as needed   Family/ staff Communication: Reviewed plan of care with patient verbalized understanding   Labs/tests ordered:  - CBC with Differential/Platelet - CMP with eGFR([facility]) - TSH - Hgb A1C - Lipid panel - Hep C Antibody - Bone density  - Microalbumin / Creatine ratio  Next Appointment : Return in about 4 months (around 06/08/2024) for medical mangement of chronic issues., fasting labs in one week.   Spent 45 minutes of Face to face and non-face to face with patient  >50% time spent counseling; reviewing medical record; tests; labs; documentation and developing future plan of care.  Estil Heman, NP

## 2024-02-08 ENCOUNTER — Other Ambulatory Visit: Payer: Self-pay

## 2024-02-08 ENCOUNTER — Other Ambulatory Visit

## 2024-02-08 DIAGNOSIS — E669 Obesity, unspecified: Secondary | ICD-10-CM

## 2024-02-08 DIAGNOSIS — E782 Mixed hyperlipidemia: Secondary | ICD-10-CM

## 2024-02-08 DIAGNOSIS — Z1159 Encounter for screening for other viral diseases: Secondary | ICD-10-CM

## 2024-02-08 DIAGNOSIS — I10 Essential (primary) hypertension: Secondary | ICD-10-CM

## 2024-02-09 LAB — COMPLETE METABOLIC PANEL WITHOUT GFR
AG Ratio: 1.9 (calc) (ref 1.0–2.5)
ALT: 20 U/L (ref 6–29)
AST: 16 U/L (ref 10–35)
Albumin: 4.6 g/dL (ref 3.6–5.1)
Alkaline phosphatase (APISO): 73 U/L (ref 37–153)
BUN: 9 mg/dL (ref 7–25)
CO2: 23 mmol/L (ref 20–32)
Calcium: 9.6 mg/dL (ref 8.6–10.4)
Chloride: 95 mmol/L — ABNORMAL LOW (ref 98–110)
Creat: 0.64 mg/dL (ref 0.50–1.05)
Globulin: 2.4 g/dL (ref 1.9–3.7)
Glucose, Bld: 89 mg/dL (ref 65–99)
Potassium: 3.9 mmol/L (ref 3.5–5.3)
Sodium: 131 mmol/L — ABNORMAL LOW (ref 135–146)
Total Bilirubin: 0.4 mg/dL (ref 0.2–1.2)
Total Protein: 7 g/dL (ref 6.1–8.1)

## 2024-02-09 LAB — HEPATITIS C ANTIBODY: Hepatitis C Ab: NONREACTIVE

## 2024-02-09 LAB — LIPID PANEL
Cholesterol: 254 mg/dL — ABNORMAL HIGH
HDL: 65 mg/dL
LDL Cholesterol (Calc): 164 mg/dL — ABNORMAL HIGH
Non-HDL Cholesterol (Calc): 189 mg/dL — ABNORMAL HIGH
Total CHOL/HDL Ratio: 3.9 (calc)
Triglycerides: 124 mg/dL

## 2024-02-09 LAB — HEMOGLOBIN A1C
Hgb A1c MFr Bld: 5.8 % — ABNORMAL HIGH
Mean Plasma Glucose: 120 mg/dL
eAG (mmol/L): 6.6 mmol/L

## 2024-02-09 LAB — CBC WITH DIFFERENTIAL/PLATELET
Absolute Lymphocytes: 2754 {cells}/uL (ref 850–3900)
Absolute Monocytes: 402 {cells}/uL (ref 200–950)
Basophils Absolute: 29 {cells}/uL (ref 0–200)
Basophils Relative: 0.3 %
Eosinophils Absolute: 127 {cells}/uL (ref 15–500)
Eosinophils Relative: 1.3 %
HCT: 38.9 % (ref 35.0–45.0)
Hemoglobin: 13 g/dL (ref 11.7–15.5)
MCH: 30 pg (ref 27.0–33.0)
MCHC: 33.4 g/dL (ref 32.0–36.0)
MCV: 89.6 fL (ref 80.0–100.0)
MPV: 9.2 fL (ref 7.5–12.5)
Monocytes Relative: 4.1 %
Neutro Abs: 6488 {cells}/uL (ref 1500–7800)
Neutrophils Relative %: 66.2 %
Platelets: 398 10*3/uL (ref 140–400)
RBC: 4.34 10*6/uL (ref 3.80–5.10)
RDW: 12.7 % (ref 11.0–15.0)
Total Lymphocyte: 28.1 %
WBC: 9.8 10*3/uL (ref 3.8–10.8)

## 2024-02-09 LAB — MICROALBUMIN / CREATININE URINE RATIO
Creatinine, Urine: 45 mg/dL (ref 20–275)
Microalb Creat Ratio: 4 mg/g{creat} (ref <30)
Microalb, Ur: 0.2 mg/dL

## 2024-02-09 LAB — TSH: TSH: 1.14 m[IU]/L (ref 0.40–4.50)

## 2024-02-13 ENCOUNTER — Ambulatory Visit: Payer: Self-pay | Admitting: Family

## 2024-02-13 ENCOUNTER — Encounter: Payer: Self-pay | Admitting: Physician Assistant

## 2024-02-13 ENCOUNTER — Ambulatory Visit: Admitting: Physician Assistant

## 2024-02-13 VITALS — BP 136/80

## 2024-02-13 DIAGNOSIS — D492 Neoplasm of unspecified behavior of bone, soft tissue, and skin: Secondary | ICD-10-CM

## 2024-02-13 DIAGNOSIS — L918 Other hypertrophic disorders of the skin: Secondary | ICD-10-CM | POA: Diagnosis not present

## 2024-02-13 DIAGNOSIS — L820 Inflamed seborrheic keratosis: Secondary | ICD-10-CM

## 2024-02-13 DIAGNOSIS — D489 Neoplasm of uncertain behavior, unspecified: Secondary | ICD-10-CM

## 2024-02-13 DIAGNOSIS — Z808 Family history of malignant neoplasm of other organs or systems: Secondary | ICD-10-CM

## 2024-02-13 NOTE — Progress Notes (Signed)
   New Patient Visit   Subjective  Terri Daniel is a 68 y.o. female who presents for the following: Spot Check  Patient has a spot on L eyebrow. Has been treated with LN2 (x 3) in the past but it has come back. It itches a little bit. Previously was flat but now has raised area within it. Denies bleeding. Family hx of skin cancer. No personal hx of skin cancer.   Patient also has irritated skin tag on left chest that she is interested in getting removed.    The following portions of the chart were reviewed this encounter and updated as appropriate: medications, allergies, medical history  Review of Systems:  No other skin or systemic complaints except as noted in HPI or Assessment and Plan.  Objective  Well appearing patient in no apparent distress; mood and affect are within normal limits.  A focused examination was performed of the following areas: Face and upper chest.   Relevant physical exam findings are noted in the Assessment and Plan.       Left Eyebrow Tan/brown plaque       Chest - Medial (Center) Inflamed pedunculated papule   Assessment & Plan   NEOPLASM OF UNCERTAIN BEHAVIOR Left Eyebrow Epidermal / dermal shaving  Lesion diameter (cm):  1.5 Informed consent: discussed and consent obtained   Timeout: patient name, date of birth, surgical site, and procedure verified   Patient was prepped and draped in usual sterile fashion: area prepped with alcohol. Anesthesia: the lesion was anesthetized in a standard fashion   Anesthetic:  1% lidocaine  w/ epinephrine 1-100,000 local infiltration Instrument used: flexible razor blade   Hemostasis achieved with: pressure, aluminum chloride and electrodesiccation   Outcome: patient tolerated procedure well   Post-procedure details: wound care instructions given   Post-procedure details comment:  Ointment and a small bandage applied Specimen 1 - Surgical pathology Differential Diagnosis: SK vs SCC  Check  Margins: No INFLAMED SKIN TAG Chest - Medial (Center) Destruction of lesion - Chest - Medial (Center) Complexity: simple   Destruction method: cryotherapy   Informed consent: discussed and consent obtained   Timeout:  patient name, date of birth, surgical site, and procedure verified Lesion destroyed using liquid nitrogen: Yes   Region frozen until ice ball extended beyond lesion: Yes   Outcome: patient tolerated procedure well with no complications   Post-procedure details: wound care instructions given   SEBORRHEIC KERATOSIS vs SCC - Stuck-on, waxy, tan-brown papules and/or plaques  -Raised portion of lesion -Biopsy       Return for FBSE.  I, Anner Bars, CMA, am acting as scribe for Sansa Alkema K, PA-C.   Documentation: I have reviewed the above documentation for accuracy and completeness, and I agree with the above.  Aedyn Mckeon K, PA-C

## 2024-02-15 ENCOUNTER — Other Ambulatory Visit: Payer: Self-pay

## 2024-02-15 MED ORDER — EZETIMIBE 10 MG PO TABS: 10.0000 mg | ORAL_TABLET | Freq: Every day | ORAL | 1 refills | Status: DC

## 2024-02-17 ENCOUNTER — Ambulatory Visit: Payer: Self-pay | Admitting: Physician Assistant

## 2024-02-25 ENCOUNTER — Ambulatory Visit: Admitting: Physician Assistant

## 2024-02-27 DIAGNOSIS — E782 Mixed hyperlipidemia: Secondary | ICD-10-CM

## 2024-02-27 DIAGNOSIS — E1169 Type 2 diabetes mellitus with other specified complication: Secondary | ICD-10-CM

## 2024-02-27 NOTE — Telephone Encounter (Signed)
 For BMP, you do not need to fast.

## 2024-02-27 NOTE — Telephone Encounter (Signed)
 Message routed to Ella Bodo, NP due to PCP Ngetich, Donalee Citrin, NP being out of office.

## 2024-02-28 NOTE — Telephone Encounter (Signed)
 Message routed to Monina, Vargas, NP

## 2024-02-28 NOTE — Telephone Encounter (Signed)
 Provider message copy, pasted, and sent to patient via MyChart.

## 2024-02-28 NOTE — Telephone Encounter (Signed)
Message routed to Jessica Eubanks, NP 

## 2024-02-29 NOTE — Telephone Encounter (Signed)
 Message routed to [facility].B/CMA CI coverage for today and Harlene An, NP

## 2024-03-02 NOTE — Addendum Note (Signed)
 Addended by: CARO HARLENE POUR on: 03/02/2024 11:55 AM   Modules accepted: Orders

## 2024-03-03 ENCOUNTER — Other Ambulatory Visit: Payer: Self-pay | Admitting: Orthopedic Surgery

## 2024-03-06 ENCOUNTER — Ambulatory Visit: Admitting: Physician Assistant

## 2024-03-18 NOTE — Patient Instructions (Addendum)
 SURGICAL WAITING ROOM VISITATION  Patients having surgery or a procedure may have no more than 2 support people in the waiting area - these visitors may rotate.    Children under the age of 58 must have an adult with them who is not the patient.  Visitors with respiratory illnesses are discouraged from visiting and should remain at home.  If the patient needs to stay at the hospital during part of their recovery, the visitor guidelines for inpatient rooms apply. Pre-op  nurse will coordinate an appropriate time for 1 support person to accompany patient in pre-op .  This support person may not rotate.    Please refer to the [facility] website for the visitor guidelines for Inpatients (after your surgery is over and you are in a regular room).       Your procedure is scheduled on: 04/03/24   Report to [facility] Main Entrance    Report to admitting at 5:15 AM   Call this number if you have problems the morning of surgery [phone]   Do not eat food :After Midnight.   After Midnight you may have the following liquids until 4:30 AM DAY OF SURGERY  Water Non-Citrus Juices (without pulp, NO RED-Apple, White grape, White cranberry) Black Coffee (NO MILK/CREAM OR CREAMERS, sugar ok)  Clear Tea (NO MILK/CREAM OR CREAMERS, sugar ok) regular and decaf                             Plain Jell-O (NO RED)                                           Fruit ices (not with fruit pulp, NO RED)                                     Popsicles (NO RED)                                                               Sports drinks like Gatorade (NO RED)                  The day of surgery:  Drink ONE (1) Pre-Surgery  G2 at 4:30 AM the morning of surgery. Drink in one sitting. Do not sip.  This drink was given to you during your hospital  pre-op  appointment visit. Nothing else to drink after completing the Pre-Surgery  G2.        Oral Hygiene is also important to reduce your risk of infection.                                     Remember - BRUSH YOUR TEETH THE MORNING OF SURGERY WITH YOUR REGULAR TOOTHPASTE  DENTURES WILL BE REMOVED PRIOR TO SURGERY PLEASE DO NOT APPLY Poly grip OR ADHESIVES!!!   Do NOT smoke after Midnight   Stop all vitamins and herbal supplements 7 days before surgery.   Take these medicines the morning of surgery  with A SIP OF WATER:  Amlodipine Cetirizine Hydroxyzine Omeprazole Oxcarbazepine(trileptal) Tamsulosin Vilazodone Okay to use eyedrops, nasal spray, inhaler.  Hold vitamins and herbal supplements 7 days before surgery   How to Manage Your Diabetes Before and After Surgery  Why is it important to control my blood sugar before and after surgery? Improving blood sugar levels before and after surgery helps healing and can limit problems. A way of improving blood sugar control is eating a healthy diet by:  Eating less sugar and carbohydrates  Increasing activity/exercise  Talking with your doctor about reaching your blood sugar goals High blood sugars (greater than 180 mg/dL) can raise your risk of infections and slow your recovery, so you will need to focus on controlling your diabetes during the weeks before surgery. Make sure that the doctor who takes care of your diabetes knows about your planned surgery including the date and location.  How do I manage my blood sugar before surgery? Check your blood sugar at least 4 times a day, starting 2 days before surgery, to make sure that the level is not too high or low. Check your blood sugar the morning of your surgery when you wake up and every 2 hours until you get to the Short Stay unit. If your blood sugar is less than 70 mg/dL, you will need to treat for low blood sugar: Do not take insulin . Treat a low blood sugar (less than 70 mg/dL) with  cup of clear juice (cranberry or apple), 4 glucose tablets, OR glucose gel. Recheck blood sugar in 15 minutes after treatment (to make sure it is  greater than 70 mg/dL). If your blood sugar is not greater than 70 mg/dL on recheck, call [phone] for further instructions. Report your blood sugar to the short stay nurse when you get to Short Stay.  If you are admitted to the hospital after surgery: Your blood sugar will be checked by the staff and you will probably be given insulin  after surgery (instead of oral diabetes medicines) to make sure you have good blood sugar levels. The goal for blood sugar control after surgery is 80-180 mg/dL.   WHAT DO I DO ABOUT MY DIABETES MEDICATION?  Do not take oral diabetes medicines (pills) the morning of surgery.  Hold Mounjaro 7 days before surgery (do not take after 03-26-24)  DO NOT TAKE THE FOLLOWING 7 DAYS PRIOR TO SURGERY: Ozempic, Wegovy, Rybelsus (Semaglutide), Byetta (exenatide), Bydureon (exenatide ER), Victoza, Saxenda (liraglutide), or Trulicity (dulaglutide) Mounjaro (Tirzepatide) Adlyxin (Lixisenatide), Polyethylene Glycol Loxenatide.  Reviewed and Endorsed by [facility] Patient Education Committee, August 2015             You may not have any metal on your body including hair pins, jewelry, and body piercing             Do not wear make-up, lotions, powders, perfumes or deodorant  Do not wear nail polish including gel and S&S, artificial/acrylic nails, or any other type of covering on natural nails including finger and toenails. If you have artificial nails, gel coating, etc. that needs to be removed by a nail salon please have this removed prior to surgery or surgery may need to be canceled/ delayed if the surgeon/ anesthesia feels like they are unable to be safely monitored.   Do not shave  48 hours prior to surgery.    Do not bring valuables to the hospital. [facility] IS NOT RESPONSIBLE   FOR VALUABLES.   Contacts, glasses, dentures or  bridgework may not be worn into surgery.  DO NOT BRING YOUR HOME MEDICATIONS TO THE HOSPITAL. PHARMACY WILL DISPENSE MEDICATIONS LISTED  ON YOUR MEDICATION LIST TO YOU DURING YOUR ADMISSION IN THE HOSPITAL!    Patients discharged on the day of surgery will not be allowed to drive home.  Someone NEEDS to stay with you for the first 24 hours after anesthesia.   Special Instructions: Bring a copy of your healthcare power of attorney and living will documents the day of surgery if you haven't scanned them before.              Please read over the following fact sheets you were given: IF YOU HAVE QUESTIONS ABOUT YOUR PRE-OP  INSTRUCTIONS PLEASE CALL [phone] [facility]   If you received a COVID test during your pre-op  visit  it is requested that you wear a mask when out in public, stay away from anyone that may not be feeling well and notify your surgeon if you develop symptoms. If you test positive for Covid or have been in contact with anyone that has tested positive in the last 10 days please notify you surgeon.  [place]- Preparing for Total Shoulder Arthroplasty    Before surgery, you can play an important role. Because skin is not sterile, your skin needs to be as free of germs as possible. You can reduce the number of germs on your skin by using the following products. Benzoyl Peroxide Gel Reduces the number of germs present on the skin Applied twice a day to shoulder area starting two days before surgery    ==================================================================  Please follow these instructions carefully:  BENZOYL PEROXIDE 5% GEL  Please do not use if you have an allergy to benzoyl peroxide.   If your skin becomes reddened/irritated stop using the benzoyl peroxide.  Starting two days before surgery, apply as follows: Apply benzoyl peroxide in the morning and at night. Apply after taking a shower. If you are not taking a shower clean entire shoulder front, back, and side along with the armpit with a clean wet washcloth.  Place a quarter-sized dollop on your shoulder and rub in thoroughly, making sure to  cover the front, back, and side of your shoulder, along with the armpit.   2 days before ____ AM   ____ PM              1 day before ____ AM   ____ PM                         Do this twice a day for two days.  (Last application is the night before surgery, AFTER using the CHG soap as described below).  Do NOT apply benzoyl peroxide gel on the day of surgery.       Pre-operative 5 CHG Bath Instructions   You can play a key role in reducing the risk of infection after surgery. Your skin needs to be as free of germs as possible. You can reduce the number of germs on your skin by washing with CHG (chlorhexidine  gluconate) soap before surgery. CHG is an antiseptic soap that kills germs and continues to kill germs even after washing.   DO NOT use if you have an allergy to chlorhexidine /CHG or antibacterial soaps. If your skin becomes reddened or irritated, stop using the CHG and notify one of our RNs at [phone].   Please shower with the CHG soap starting 4 days before surgery using  the following schedule:     Please keep in mind the following:  DO NOT shave, including legs and underarms, starting the day of your first shower.   You may shave your face at any point before/day of surgery.  Place clean sheets on your bed the day you start using CHG soap. Use a clean washcloth (not used since being washed) for each shower. DO NOT sleep with pets once you start using the CHG.   CHG Shower Instructions:  If you choose to wash your hair and private area, wash first with your normal shampoo/soap.  After you use shampoo/soap, rinse your hair and body thoroughly to remove shampoo/soap residue.  Turn the water OFF and apply about 3 tablespoons (45 ml) of CHG soap to a CLEAN washcloth.  Apply CHG soap ONLY FROM YOUR NECK DOWN TO YOUR TOES (washing for 3-5 minutes)  DO NOT use CHG soap on face, private areas, open wounds, or sores.  Pay special attention to the area where your surgery is being  performed.  If you are having back surgery, having someone wash your back for you may be helpful. Wait 2 minutes after CHG soap is applied, then you may rinse off the CHG soap.  Pat dry with a clean towel  Put on clean clothes/pajamas   If you choose to wear lotion, please use ONLY the CHG-compatible lotions on the back of this paper.     Additional instructions for the day of surgery: DO NOT APPLY any lotions, deodorants, cologne, or perfumes.   Put on clean/comfortable clothes.  Brush your teeth.  Ask your nurse before applying any prescription medications to the skin.      CHG Compatible Lotions   Aveeno Moisturizing lotion  Cetaphil Moisturizing Cream  Cetaphil Moisturizing Lotion  Clairol Herbal Essence Moisturizing Lotion, Dry Skin  Clairol Herbal Essence Moisturizing Lotion, Extra Dry Skin  Clairol Herbal Essence Moisturizing Lotion, Normal Skin  Curel Age Defying Therapeutic Moisturizing Lotion with Alpha Hydroxy  Curel Extreme Care Body Lotion  Curel Soothing Hands Moisturizing Hand Lotion  Curel Therapeutic Moisturizing Cream, Fragrance-Free  Curel Therapeutic Moisturizing Lotion, Fragrance-Free  Curel Therapeutic Moisturizing Lotion, Original Formula  Eucerin Daily Replenishing Lotion  Eucerin Dry Skin Therapy Plus Alpha Hydroxy Crme  Eucerin Dry Skin Therapy Plus Alpha Hydroxy Lotion  Eucerin Original Crme  Eucerin Original Lotion  Eucerin Plus Crme Eucerin Plus Lotion  Eucerin TriLipid Replenishing Lotion  Keri Anti-Bacterial Hand Lotion  Keri Deep Conditioning Original Lotion Dry Skin Formula Softly Scented  Keri Deep Conditioning Original Lotion, Fragrance Free Sensitive Skin Formula  Keri Lotion Fast Absorbing Fragrance Free Sensitive Skin Formula  Keri Lotion Fast Absorbing Softly Scented Dry Skin Formula  Keri Original Lotion  Keri Skin Renewal Lotion Keri Silky Smooth Lotion  Keri Silky Smooth Sensitive Skin Lotion  Nivea Body Creamy Conditioning  Oil  Nivea Body Extra Enriched Lotion  Nivea Body Original Lotion  Nivea Body Sheer Moisturizing Lotion Nivea Crme  Nivea Skin Firming Lotion  NutraDerm 30 Skin Lotion  NutraDerm Skin Lotion  NutraDerm Therapeutic Skin Cream  NutraDerm Therapeutic Skin Lotion  ProShield Protective Hand Cream   Incentive Spirometer  An incentive spirometer is a tool that can help keep your lungs clear and active. This tool measures how well you are filling your lungs with each breath. Taking long deep breaths may help reverse or decrease the chance of developing breathing (pulmonary) problems (especially infection) following: A long period of time when you are unable to  move or be active. BEFORE THE PROCEDURE  If the spirometer includes an indicator to show your best effort, your nurse or respiratory therapist will set it to a desired goal. If possible, sit up straight or lean slightly forward. Try not to slouch. Hold the incentive spirometer in an upright position. INSTRUCTIONS FOR USE  Sit on the edge of your bed if possible, or sit up as far as you can in bed or on a chair. Hold the incentive spirometer in an upright position. Breathe out normally. Place the mouthpiece in your mouth and seal your lips tightly around it. Breathe in slowly and as deeply as possible, raising the piston or the ball toward the top of the column. Hold your breath for 3-5 seconds or for as long as possible. Allow the piston or ball to fall to the bottom of the column. Remove the mouthpiece from your mouth and breathe out normally. Rest for a few seconds and repeat Steps 1 through 7 at least 10 times every 1-2 hours when you are awake. Take your time and take a few normal breaths between deep breaths. The spirometer may include an indicator to show your best effort. Use the indicator as a goal to work toward during each repetition. After each set of 10 deep breaths, practice coughing to be sure your lungs are clear. If you  have an incision (the cut made at the time of surgery), support your incision when coughing by placing a pillow or rolled up towels firmly against it. Once you are able to get out of bed, walk around indoors and cough well. You may stop using the incentive spirometer when instructed by your caregiver.  RISKS AND COMPLICATIONS Take your time so you do not get dizzy or light-headed. If you are in pain, you may need to take or ask for pain medication before doing incentive spirometry. It is harder to take a deep breath if you are having pain. AFTER USE Rest and breathe slowly and easily. It can be helpful to keep track of a log of your progress. Your caregiver can provide you with a simple table to help with this. If you are using the spirometer at home, follow these instructions: SEEK MEDICAL CARE IF:  You are having difficultly using the spirometer. You have trouble using the spirometer as often as instructed. Your pain medication is not giving enough relief while using the spirometer. You develop fever of 100.5 F (38.1 C) or higher. SEEK IMMEDIATE MEDICAL CARE IF:  You cough up bloody sputum that had not been present before. You develop fever of 102 F (38.9 C) or greater. You develop worsening pain at or near the incision site. MAKE SURE YOU:  Understand these instructions. Will watch your condition. Will get help right away if you are not doing well or get worse. Document Released: 01/01/2007 Document Revised: 11/13/2011 Document Reviewed: 03/04/2007 [facility] Patient Information 2014 [place], [place].

## 2024-03-18 NOTE — Progress Notes (Signed)
 COVID Vaccine received:  []  No []  Yes Date of any COVID positive Test in last 90 days:  PCP - Roxan Plough NP Cardiologist -   Chest x-ray - 12/11/23 Epic EKG -  12/14/23 Epic Stress Test -  ECHO - 02/06/17 Epic Cardiac Cath - 02/07/17 Epic  Bowel Prep - []  No  []   Yes ______  Pacemaker / ICD device []  No []  Yes   Spinal Cord Stimulator:[]  No []  Yes       History of Sleep Apnea? []  No []  Yes   CPAP used?- []  No []  Yes    Does the patient monitor blood sugar?          []  No []  Yes  []  N/A  Patient has: []  NO Hx DM   []  Pre-DM                 []  DM1  []   DM2 Does patient have a [employer] or Dexacom? []  No []  Yes   Fasting Blood Sugar Ranges-  Checks Blood Sugar _____ times a day  GLP1 agonist / usual dose -  GLP1 instructions:  SGLT-2 inhibitors / usual dose -  SGLT-2 instructions:   Blood Thinner / Instructions: Aspirin  Instructions:  Comments:   Activity level: Patient is able / unable to climb a flight of stairs without difficulty; []  No CP  []  No SOB, but would have ___   Patient can / can not perform ADLs without assistance.   Anesthesia review:   Patient denies shortness of breath, fever, cough and chest pain at PAT appointment.  Patient verbalized understanding and agreement to the Pre-Surgical Instructions that were given to them at this PAT appointment. Patient was also educated of the need to review these PAT instructions again prior to his/her surgery.I reviewed the appropriate phone numbers to call if they have any and questions or concerns.

## 2024-03-24 ENCOUNTER — Other Ambulatory Visit: Payer: Self-pay

## 2024-03-24 ENCOUNTER — Encounter (HOSPITAL_COMMUNITY): Payer: Self-pay

## 2024-03-24 ENCOUNTER — Encounter (HOSPITAL_COMMUNITY)
Admission: RE | Admit: 2024-03-24 | Discharge: 2024-03-24 | Disposition: A | Discharge status: Home / Self Care | Source: Ambulatory Visit | Attending: Orthopedic Surgery

## 2024-03-24 VITALS — BP 138/86 | HR 76 | Temp 98.2°F | Resp 16 | Ht 62.0 in | Wt 149.2 lb

## 2024-03-24 DIAGNOSIS — E119 Type 2 diabetes mellitus without complications: Secondary | ICD-10-CM | POA: Diagnosis not present

## 2024-03-24 DIAGNOSIS — Z01818 Encounter for other preprocedural examination: Secondary | ICD-10-CM

## 2024-03-24 DIAGNOSIS — Z01812 Encounter for preprocedural laboratory examination: Secondary | ICD-10-CM | POA: Diagnosis present

## 2024-03-24 DIAGNOSIS — I10 Essential (primary) hypertension: Secondary | ICD-10-CM | POA: Insufficient documentation

## 2024-03-24 HISTORY — DX: Other complications of anesthesia, initial encounter: T88.59XA

## 2024-03-24 HISTORY — DX: Family history of other specified conditions: Z84.89

## 2024-03-24 LAB — CBC
HCT: 38.3 % (ref 36.0–46.0)
Hemoglobin: 13.4 g/dL (ref 12.0–15.0)
MCH: 30.6 pg (ref 26.0–34.0)
MCHC: 35 g/dL (ref 30.0–36.0)
MCV: 87.4 fL (ref 80.0–100.0)
Platelets: 418 K/uL — ABNORMAL HIGH (ref 150–400)
RBC: 4.38 MIL/uL (ref 3.87–5.11)
RDW: 12.4 % (ref 11.5–15.5)
WBC: 10.7 K/uL — ABNORMAL HIGH (ref 4.0–10.5)
nRBC: 0 % (ref 0.0–0.2)

## 2024-03-24 LAB — GLUCOSE, CAPILLARY: Glucose-Capillary: 107 mg/dL — ABNORMAL HIGH (ref 70–99)

## 2024-03-24 LAB — BASIC METABOLIC PANEL WITH GFR
Anion gap: 12 (ref 5–15)
BUN: 10 mg/dL (ref 8–23)
CO2: 23 mmol/L (ref 22–32)
Calcium: 9.4 mg/dL (ref 8.9–10.3)
Chloride: 94 mmol/L — ABNORMAL LOW (ref 98–111)
Creatinine, Ser: 0.68 mg/dL (ref 0.44–1.00)
GFR, Estimated: >60 mL/min (ref >60)
Glucose, Bld: 107 mg/dL — ABNORMAL HIGH (ref 70–99)
Potassium: 3.2 mmol/L — ABNORMAL LOW (ref 3.5–5.1)
Sodium: 129 mmol/L — ABNORMAL LOW (ref 135–145)

## 2024-03-24 LAB — SURGICAL PCR SCREEN
MRSA, PCR: NEGATIVE
Staphylococcus aureus: NEGATIVE

## 2024-03-24 NOTE — Progress Notes (Addendum)
 COVID Vaccine Completed:  Date of COVID positive in last 90 days:  No  PCP - Dinah Ngetich, NP Cardiologist -  saw a cardiologist in the hospital in 2018  Chest x-ray - 12-11-23 Epic EKG - 12-14-23 Epic Stress Test - N/A ECHO - 02-06-17 Epic Cardiac Cath - 02-07-17 Epic Pacemaker/ICD device last checked:  N/A Spinal Cord Stimulator:  N/A  Bowel Prep - N/A  Sleep Study - Yes CPAP - Yes  Fasting Blood Sugar - 100 to 110 Checks Blood Sugar - every few weeks   Mounjaro Last dose of GLP1 agonist-  N/A GLP1 instructions:  Do not take after 03-26-24   Last dose of SGLT-2 inhibitors-  N/A SGLT-2 instructions:  Do not take after    Blood Thinner Instructions:  Last dose:   Time: Aspirin  Instructions: Last Dose:  Activity level:  Can go up a flight of stairs and perform activities of daily living without stopping and without symptoms of chest pain or shortness of breath.  Able to exercise without symptoms  Anesthesia review:  Saw cardiology in hospital 2018 for chest pain.  Seen in ER 12-11-23 for chest pain.  Has not had chest pain since ER visit.  Fibromyalgia  Patient denies shortness of breath, fever, cough and chest pain at PAT appointment  Patient verbalized understanding of instructions that were given to them at the PAT appointment. Patient was also instructed that they will need to review over the PAT instructions again at home before surgery.

## 2024-04-01 ENCOUNTER — Ambulatory Visit: Admitting: Cardiovascular Disease

## 2024-04-14 NOTE — Progress Notes (Signed)
 Case: [id number] Date/Time: 04/17/24 0945   Procedure: ARTHROPLASTY, SHOULDER, TOTAL, REVERSE (Right: Shoulder)   Anesthesia type: Choice   Diagnosis:      Primary osteoarthritis of right shoulder [M19.011]     Complete tear of right rotator cuff, unspecified whether traumatic [M75.121]   Pre-op  diagnosis: RIGHT SHOULDER OSTEOARTHRITIS AND ROTATOR CUFF TEAR   Location: [facility] ROOM 07 / [facility]   Surgeons: Dozier Soulier, MD       DISCUSSION: Terri Daniel is a 68 yo female with PMH of current smoking, HTN, asthma, GERD, prediabetes, fibromyalgia, arthritis, anxiety, depression.  Prior anesthesia complication includes prolonged emergence, and difficult to numb  ED visits in 05/2023 and 12/2023 for chest pain with muscle cramping. W/u in the ED including EKG, CXR, labs (troponin, d-dimer) were all reassuring. She was advised to f/u in Cardiology due to risk factors for heart disease. Of note patient was scheduled for cardiac clearance however she canceled the appointment per chart review. Discussed with patient. She reports both times she was having a fibromyalgia flare which she attributes to weather changes or rainy weather. She also states that she had terrible muscle cramps from taking Crestor  and zetia . She states she talked to a friend who is a Development worker, international aid in [place] and was advised that she can likely delay seeing Cardiology until after surgery so she rescheduled her appointment. She states she currently can go up a flight of stairs and perform activities of daily living without stopping and without symptoms of chest pain or shortness of breath.   Previous cardiac eval includes cardiac cath in 2018 when she was admitted for possible MI. Cath showed minimal nonobstructive CAD at that time. Echo showed normal LVEF 60-65% with no significant valvular disease.   Seen by PCP on 02/07/24 to establish care. BP is controlled. She has prediabetes which is controlled. She has asthma with rare use of  inhaler. Medical clearance signed on 02/20/24 (scanned in media on 6/20).   She states she established care with a new PCP at [facility]. Labs reepated on 04/03/24. Na stable at 130. K 3.4.  VS: BP 138/86   Pulse 76   Temp 36.8 C (Oral)   Resp 16   Ht 5' 2 (1.575 m)   Wt 67.7 kg   SpO2 99%   BMI 27.29 kg/m   PROVIDERS: [employer] -    LABS: Labs reviewed: Acceptable for surgery. (See most recent labs from [facility] in chart on 04/03/24 (all labs ordered are listed, but only abnormal results are displayed)  Labs Reviewed  BASIC METABOLIC PANEL WITH GFR - Abnormal; Notable for the following components:      Result Value   Sodium 129 (*)    Potassium 3.2 (*)    Chloride 94 (*)    Glucose, Bld 107 (*)    All other components within normal limits  CBC - Abnormal; Notable for the following components:   WBC 10.7 (*)    Platelets 418 (*)    All other components within normal limits  GLUCOSE, CAPILLARY - Abnormal; Notable for the following components:   Glucose-Capillary 107 (*)    All other components within normal limits  SURGICAL PCR SCREEN     IMAGES: CXR 12/11/23  FINDINGS: Normal lung volumes. No focal consolidations. No pleural effusion or pneumothorax. The heart size and mediastinal contours are within normal limits. No acute osseous abnormality.   IMPRESSION: No active cardiopulmonary disease.  EKG 12/11/23:  Normal sinus rhythm  Low voltage QRS Nonspecific ST and T wave abnormality Abnormal ECG When compared with ECG of 11-May-2023 09:49, Slight ST depressoin  CV:  LHC 02/07/2017:  Mid Cx lesion, 20 %stenosed. The left ventricular ejection fraction is 55-65% by visual estimate. The left ventricular systolic function is normal. LV end diastolic pressure is normal.   No significant coronary obstructive disease with only mild 20% luminal irregularity in the AV groove circumflex prior to the takeoff of a marginal vessel; normal LAD,  small ramus intermediate, and dominant RCA.   Normal LV function with an ejection fraction at 55-60%.   RECOMMENDATION: Medical therapy.  Echo 02/06/2017:  Study Conclusions  - Left ventricle: The cavity size was normal. Systolic function was   normal. The estimated ejection fraction was in the range of 60%   to 65%. Wall motion was normal; there were no regional wall   motion abnormalities. Left ventricular diastolic function   parameters were normal. - Atrial septum: No defect or patent foramen ovale was identified. - Impressions: Prominent epicardial fat pad.  Impressions:  - Prominent epicardial fat pad. Past Medical History:  Diagnosis Date   Anxiety    Arthritis    Asthma    Bulging lumbar disc    Chronic interstitial cystitis    Complication of anesthesia    Slow to wake up, difficult to numb   Depression    Diabetes mellitus without complication (HCC)    Disorder of left rotator cuff    Family history of adverse reaction to anesthesia    Mother hard to wake up   Fibromyalgia    GERD (gastroesophageal reflux disease)    Glaucoma    HLD (hyperlipidemia)    Hypertension    Hypertensive retinopathy    Tobacco abuse    Transaminitis     Past Surgical History:  Procedure Laterality Date   ABDOMINAL HYSTERECTOMY     BLEPHAROPLASTY Bilateral    BREAST BIOPSY Left    BREAST CYST EXCISION Right    CATARACT EXTRACTION     CYSTO     CYSTOSCOPY     EYE SURGERY  12/2023   EYE SURGERY Left 09/2023   hydrodistention     with botox   JOINT REPLACEMENT Left 2009   Knee   LEFT HEART CATH AND CORONARY ANGIOGRAPHY N/A 02/07/2017   Procedure: Left Heart Cath and Coronary Angiography;  Surgeon: Burnard Debby LABOR, MD;  Location: [facility] INVASIVE CV LAB;  Service: Cardiovascular;  Laterality: N/A;   TUBAL LIGATION     ULNAR NERVE REPAIR      MEDICATIONS:  ACCU-CHEK GUIDE test strip   albuterol (PROVENTIL HFA;VENTOLIN HFA) 108 (90 Base) MCG/ACT inhaler   amLODipine  (NORVASC) 2.5 MG tablet   Ascorbic Acid (VITAMIN C PO)   bimatoprost (LUMIGAN) 0.01 % SOLN   CALCIUM  PO   Camphor-Menthol-Methyl Sal (SALONPAS) 3.09-09-08 % PTCH   diclofenac sodium (VOLTAREN) 1 % GEL   ezetimibe  (ZETIA ) 10 MG tablet   hydrOXYzine (ATARAX/VISTARIL) 25 MG tablet   hyoscyamine (LEVBID) 0.375 MG 12 hr tablet   ibuprofen (ADVIL) 200 MG tablet   lisinopril-hydrochlorothiazide (PRINZIDE,ZESTORETIC) 20-12.5 MG tablet   MAGNESIUM PO   meloxicam (MOBIC) 15 MG tablet   MOUNJARO 7.5 MG/0.5ML Pen   Multiple Vitamin (MULTIVITAMIN WITH MINERALS) TABS tablet   NON FORMULARY   omeprazole (PRILOSEC) 40 MG capsule   Oxcarbazepine (TRILEPTAL) 300 MG tablet   SYSTANE ULTRA 0.4-0.3 % SOLN   tamsulosin (FLOMAX) 0.4 MG CAPS capsule   tiZANidine  (ZANAFLEX )  4 MG tablet   Vilazodone HCl (VIIBRYD) 10 MG TABS   No current facility-administered medications for this encounter.    Burnard CHRISTELLA Odis DEVONNA [facility] Short Stay/Anesthesiology [facility] Phone [phone] 04/16/2024 1:15 PM

## 2024-04-16 ENCOUNTER — Other Ambulatory Visit (HOSPITAL_BASED_OUTPATIENT_CLINIC_OR_DEPARTMENT_OTHER): Payer: Self-pay | Admitting: Nurse Practitioner

## 2024-04-16 DIAGNOSIS — E2839 Other primary ovarian failure: Secondary | ICD-10-CM

## 2024-04-16 NOTE — Anesthesia Preprocedure Evaluation (Addendum)
 Anesthesia Evaluation  Patient identified by MRN, date of birth, ID band Patient awake    Reviewed: Allergy & Precautions, NPO status , Patient's Chart, lab work & pertinent test results, reviewed documented beta blocker date and time   History of Anesthesia Complications (+) PROLONGED EMERGENCE, Family history of anesthesia reaction and history of anesthetic complications  Airway Mallampati: II  TM Distance: >3 FB     Dental no notable dental hx.    Pulmonary asthma , neg COPD, Current Smoker   breath sounds clear to auscultation       Cardiovascular hypertension, (-) CAD, (-) Past MI, (-) Cardiac Stents and (-) CABG  Rhythm:Regular Rate:Normal     Neuro/Psych neg Seizures PSYCHIATRIC DISORDERS Anxiety Depression     Neuromuscular disease    GI/Hepatic ,GERD  Medicated,,(+) neg Cirrhosis        Endo/Other  diabetes, Type 2    Renal/GU Renal disease     Musculoskeletal  (+) Arthritis ,  Fibromyalgia -  Abdominal   Peds  Hematology   Anesthesia Other Findings   Reproductive/Obstetrics                              Anesthesia Physical Anesthesia Plan  ASA: 2  Anesthesia Plan: General   Post-op Pain Management: Regional block*   Induction: Intravenous  PONV Risk Score and Plan: 2 and Ondansetron  and Dexamethasone   Airway Management Planned: Oral ETT  Additional Equipment:   Intra-op Plan:   Post-operative Plan: Extubation in OR  Informed Consent: I have reviewed the patients History and Physical, chart, labs and discussed the procedure including the risks, benefits and alternatives for the proposed anesthesia with the patient or authorized representative who has indicated his/her understanding and acceptance.     Dental advisory given  Plan Discussed with: CRNA  Anesthesia Plan Comments: (See PAT note from 7/21)         Anesthesia Quick Evaluation

## 2024-04-16 NOTE — Progress Notes (Signed)
 Updated date of surgery: 04/17/24  Updated time of arrival: 7:30 AM  Patient will be discharged from hospital and monitored at home for 24 hours by: daughter  Patient denies any changes in allergies, medications, medical history since pre op appointment on: updated labs will be attached to chart once received.  Pre op instructions reviewed, follow up questions addressed and patient verbalized understanding at this time.

## 2024-04-17 ENCOUNTER — Encounter (HOSPITAL_COMMUNITY)
Admission: RE | Disposition: A | Discharge status: Home / Self Care | Payer: Self-pay | Source: Home / Self Care | Attending: Orthopedic Surgery

## 2024-04-17 ENCOUNTER — Other Ambulatory Visit: Payer: Self-pay

## 2024-04-17 ENCOUNTER — Ambulatory Visit (HOSPITAL_COMMUNITY)
Admission: RE | Admit: 2024-04-17 | Discharge: 2024-04-17 | Disposition: A | Discharge status: Home / Self Care | Attending: Orthopedic Surgery | Admitting: Orthopedic Surgery

## 2024-04-17 ENCOUNTER — Encounter (HOSPITAL_COMMUNITY): Payer: Self-pay | Admitting: Orthopedic Surgery

## 2024-04-17 ENCOUNTER — Ambulatory Visit (HOSPITAL_BASED_OUTPATIENT_CLINIC_OR_DEPARTMENT_OTHER): Payer: Self-pay | Admitting: Medical

## 2024-04-17 ENCOUNTER — Ambulatory Visit (HOSPITAL_COMMUNITY): Payer: Self-pay | Admitting: Medical

## 2024-04-17 DIAGNOSIS — J45909 Unspecified asthma, uncomplicated: Secondary | ICD-10-CM | POA: Insufficient documentation

## 2024-04-17 DIAGNOSIS — Z833 Family history of diabetes mellitus: Secondary | ICD-10-CM | POA: Diagnosis not present

## 2024-04-17 DIAGNOSIS — F419 Anxiety disorder, unspecified: Secondary | ICD-10-CM | POA: Diagnosis not present

## 2024-04-17 DIAGNOSIS — Z7985 Long-term (current) use of injectable non-insulin antidiabetic drugs: Secondary | ICD-10-CM | POA: Diagnosis not present

## 2024-04-17 DIAGNOSIS — E871 Hypo-osmolality and hyponatremia: Secondary | ICD-10-CM

## 2024-04-17 DIAGNOSIS — E119 Type 2 diabetes mellitus without complications: Secondary | ICD-10-CM | POA: Insufficient documentation

## 2024-04-17 DIAGNOSIS — Z79899 Other long term (current) drug therapy: Secondary | ICD-10-CM | POA: Insufficient documentation

## 2024-04-17 DIAGNOSIS — M12811 Other specific arthropathies, not elsewhere classified, right shoulder: Secondary | ICD-10-CM

## 2024-04-17 DIAGNOSIS — F32A Depression, unspecified: Secondary | ICD-10-CM | POA: Insufficient documentation

## 2024-04-17 DIAGNOSIS — M19011 Primary osteoarthritis, right shoulder: Secondary | ICD-10-CM | POA: Diagnosis present

## 2024-04-17 DIAGNOSIS — Z791 Long term (current) use of non-steroidal anti-inflammatories (NSAID): Secondary | ICD-10-CM | POA: Insufficient documentation

## 2024-04-17 DIAGNOSIS — K219 Gastro-esophageal reflux disease without esophagitis: Secondary | ICD-10-CM | POA: Insufficient documentation

## 2024-04-17 DIAGNOSIS — M75101 Unspecified rotator cuff tear or rupture of right shoulder, not specified as traumatic: Secondary | ICD-10-CM | POA: Insufficient documentation

## 2024-04-17 DIAGNOSIS — I10 Essential (primary) hypertension: Secondary | ICD-10-CM | POA: Diagnosis not present

## 2024-04-17 DIAGNOSIS — M797 Fibromyalgia: Secondary | ICD-10-CM | POA: Insufficient documentation

## 2024-04-17 DIAGNOSIS — F17210 Nicotine dependence, cigarettes, uncomplicated: Secondary | ICD-10-CM | POA: Insufficient documentation

## 2024-04-17 DIAGNOSIS — M75121 Complete rotator cuff tear or rupture of right shoulder, not specified as traumatic: Secondary | ICD-10-CM | POA: Diagnosis present

## 2024-04-17 HISTORY — PX: REVERSE SHOULDER ARTHROPLASTY: SHX5054

## 2024-04-17 LAB — POCT I-STAT, CHEM 8
BUN: 9 mg/dL (ref 8–23)
Calcium, Ion: 1.2 mmol/L (ref 1.15–1.40)
Chloride: 100 mmol/L (ref 98–111)
Creatinine, Ser: 0.8 mg/dL (ref 0.44–1.00)
Glucose, Bld: 86 mg/dL (ref 70–99)
HCT: 35 % — ABNORMAL LOW (ref 36.0–46.0)
Hemoglobin: 11.9 g/dL — ABNORMAL LOW (ref 12.0–15.0)
Potassium: 3.9 mmol/L (ref 3.5–5.1)
Sodium: 135 mmol/L (ref 135–145)
TCO2: 23 mmol/L (ref 22–32)

## 2024-04-17 LAB — GLUCOSE, CAPILLARY
Glucose-Capillary: 104 mg/dL — ABNORMAL HIGH (ref 70–99)
Glucose-Capillary: 106 mg/dL — ABNORMAL HIGH (ref 70–99)

## 2024-04-17 SURGERY — ARTHROPLASTY, SHOULDER, TOTAL, REVERSE
Anesthesia: General | Site: Shoulder | Laterality: Right

## 2024-04-17 MED ORDER — CEFAZOLIN SODIUM-DEXTROSE 2-4 GM/100ML-% IV SOLN
2.0000 g | INTRAVENOUS | Status: AC
  Administered 2024-04-17: 2 g via INTRAVENOUS
  Filled 2024-04-17: qty 100

## 2024-04-17 MED ORDER — PROPOFOL 10 MG/ML IV BOLUS
INTRAVENOUS | Status: DC | PRN
  Administered 2024-04-17: 100 mg via INTRAVENOUS

## 2024-04-17 MED ORDER — BUPIVACAINE HCL (PF) 0.5 % IJ SOLN
INTRAMUSCULAR | Status: DC | PRN
Start: 2024-04-17 — End: 2024-04-17
  Administered 2024-04-17: 10 mL

## 2024-04-17 MED ORDER — MIDAZOLAM HCL 2 MG/2ML IJ SOLN
1.0000 mg | INTRAMUSCULAR | Status: DC
  Administered 2024-04-17: 2 mg via INTRAVENOUS

## 2024-04-17 MED ORDER — CHLORHEXIDINE GLUCONATE 0.12 % MT SOLN
15.0000 mL | Freq: Once | OROMUCOSAL | Status: AC
  Administered 2024-04-17: 15 mL via OROMUCOSAL

## 2024-04-17 MED ORDER — ACETAMINOPHEN 10 MG/ML IV SOLN
INTRAVENOUS | Status: AC
  Filled 2024-04-17: qty 100

## 2024-04-17 MED ORDER — OXYCODONE HCL 5 MG PO TABS
ORAL_TABLET | ORAL | Status: AC
  Filled 2024-04-17: qty 1

## 2024-04-17 MED ORDER — ROCURONIUM BROMIDE 100 MG/10ML IV SOLN
INTRAVENOUS | Status: DC | PRN
Start: 2024-04-17 — End: 2024-04-17
  Administered 2024-04-17: 60 mg via INTRAVENOUS

## 2024-04-17 MED ORDER — ONDANSETRON HCL 4 MG/2ML IJ SOLN: 4.0000 mg | Freq: Once | INTRAMUSCULAR | Status: DC | PRN

## 2024-04-17 MED ORDER — ONDANSETRON HCL 4 MG/2ML IJ SOLN
INTRAMUSCULAR | Status: AC
  Filled 2024-04-17: qty 2

## 2024-04-17 MED ORDER — OXYCODONE HCL 5 MG PO TABS
5.0000 mg | ORAL_TABLET | Freq: Once | ORAL | Status: AC | PRN
  Administered 2024-04-17: 5 mg via ORAL

## 2024-04-17 MED ORDER — POVIDONE-IODINE 7.5 % EX SOLN: Freq: Once | CUTANEOUS | Status: DC

## 2024-04-17 MED ORDER — GLYCOPYRROLATE 0.2 MG/ML IJ SOLN
INTRAMUSCULAR | Status: DC | PRN
  Administered 2024-04-17: .2 mg via INTRAVENOUS

## 2024-04-17 MED ORDER — FENTANYL CITRATE (PF) 100 MCG/2ML IJ SOLN
INTRAMUSCULAR | Status: AC
  Filled 2024-04-17: qty 2

## 2024-04-17 MED ORDER — ONDANSETRON HCL 4 MG/2ML IJ SOLN
INTRAMUSCULAR | Status: DC | PRN
  Administered 2024-04-17: 4 mg via INTRAVENOUS

## 2024-04-17 MED ORDER — TIZANIDINE HCL 4 MG PO TABS: 4.0000 mg | ORAL_TABLET | Freq: Three times a day (TID) | ORAL | 0 refills | Status: AC | PRN

## 2024-04-17 MED ORDER — FENTANYL CITRATE PF 50 MCG/ML IJ SOSY
50.0000 ug | PREFILLED_SYRINGE | INTRAMUSCULAR | Status: DC
  Administered 2024-04-17 (×2): 50 ug via INTRAVENOUS

## 2024-04-17 MED ORDER — PHENYLEPHRINE 80 MCG/ML (10ML) SYRINGE FOR IV PUSH (FOR BLOOD PRESSURE SUPPORT)
PREFILLED_SYRINGE | INTRAVENOUS | Status: DC | PRN
  Administered 2024-04-17: 120 ug via INTRAVENOUS

## 2024-04-17 MED ORDER — SODIUM CHLORIDE 0.9 % IR SOLN
Status: DC | PRN
  Administered 2024-04-17: 1000 mL

## 2024-04-17 MED ORDER — ORAL CARE MOUTH RINSE: 15.0000 mL | Freq: Once | OROMUCOSAL | Status: AC

## 2024-04-17 MED ORDER — FENTANYL CITRATE PF 50 MCG/ML IJ SOSY: 25.0000 ug | PREFILLED_SYRINGE | INTRAMUSCULAR | Status: DC | PRN

## 2024-04-17 MED ORDER — MIDAZOLAM HCL 2 MG/2ML IJ SOLN
INTRAMUSCULAR | Status: AC
  Filled 2024-04-17: qty 2

## 2024-04-17 MED ORDER — OXYCODONE HCL 5 MG PO TABS: 5.0000 mg | ORAL_TABLET | ORAL | 0 refills | Status: DC | PRN

## 2024-04-17 MED ORDER — PROPOFOL 10 MG/ML IV BOLUS
INTRAVENOUS | Status: AC
  Filled 2024-04-17: qty 20

## 2024-04-17 MED ORDER — ROCURONIUM BROMIDE 10 MG/ML (PF) SYRINGE
PREFILLED_SYRINGE | INTRAVENOUS | Status: AC
  Filled 2024-04-17: qty 10

## 2024-04-17 MED ORDER — BUPIVACAINE LIPOSOME 1.3 % IJ SUSP
INTRAMUSCULAR | Status: DC | PRN
  Administered 2024-04-17: 10 mL

## 2024-04-17 MED ORDER — SUGAMMADEX SODIUM 200 MG/2ML IV SOLN
INTRAVENOUS | Status: DC | PRN
  Administered 2024-04-17: 200 mg via INTRAVENOUS

## 2024-04-17 MED ORDER — SUGAMMADEX SODIUM 200 MG/2ML IV SOLN
INTRAVENOUS | Status: AC
  Filled 2024-04-17: qty 2

## 2024-04-17 MED ORDER — ACETAMINOPHEN 10 MG/ML IV SOLN
1000.0000 mg | Freq: Once | INTRAVENOUS | Status: DC | PRN
Start: 2024-04-17 — End: 2024-04-17
  Administered 2024-04-17: 1000 mg via INTRAVENOUS

## 2024-04-17 MED ORDER — LACTATED RINGERS IV SOLN: INTRAVENOUS | Status: DC

## 2024-04-17 MED ORDER — DEXAMETHASONE SODIUM PHOSPHATE 10 MG/ML IJ SOLN
INTRAMUSCULAR | Status: AC
  Filled 2024-04-17: qty 1

## 2024-04-17 MED ORDER — PHENYLEPHRINE HCL-NACL 20-0.9 MG/250ML-% IV SOLN
INTRAVENOUS | Status: DC | PRN
  Administered 2024-04-17: 40 ug/min via INTRAVENOUS

## 2024-04-17 MED ORDER — INSULIN ASPART 100 UNIT/ML IJ SOLN: 0.0000 [IU] | INTRAMUSCULAR | Status: DC | PRN

## 2024-04-17 MED ORDER — WATER FOR IRRIGATION, STERILE IR SOLN
Status: DC | PRN
  Administered 2024-04-17: 2000 mL

## 2024-04-17 MED ORDER — DEXAMETHASONE SODIUM PHOSPHATE 10 MG/ML IJ SOLN
INTRAMUSCULAR | Status: DC | PRN
  Administered 2024-04-17: 8 mg via INTRAVENOUS

## 2024-04-17 MED ORDER — TRANEXAMIC ACID-NACL 1000-0.7 MG/100ML-% IV SOLN
1000.0000 mg | INTRAVENOUS | Status: AC
  Administered 2024-04-17: 1000 mg via INTRAVENOUS
  Filled 2024-04-17: qty 100

## 2024-04-17 MED ORDER — OXYCODONE HCL 5 MG/5ML PO SOLN: 5.0000 mg | Freq: Once | ORAL | Status: AC | PRN

## 2024-04-17 SURGICAL SUPPLY — 62 items
BAG COUNTER SPONGE SURGICOUNT (BAG) IMPLANT
BAG ZIPLOCK 12X15 (MISCELLANEOUS) ×1 IMPLANT
BASEPLATE P2 COATD GLND 6.5X30 (Shoulder) IMPLANT
BIT DRILL 2.5 DIA 127 CALI (BIT) IMPLANT
BIT DRILL 4 DIA CALIBRATED (BIT) IMPLANT
BLADE SAW SGTL 73X25 THK (BLADE) ×1 IMPLANT
BOOTIES KNEE HIGH SLOAN (MISCELLANEOUS) ×2 IMPLANT
COOLER ICEMAN CLASSIC (MISCELLANEOUS) ×1 IMPLANT
COVER BACK TABLE 60X90IN (DRAPES) ×1 IMPLANT
COVER SURGICAL LIGHT HANDLE (MISCELLANEOUS) ×1 IMPLANT
DRAPE INCISE IOBAN 66X45 STRL (DRAPES) ×1 IMPLANT
DRAPE POUCH INSTRU U-SHP 10X18 (DRAPES) ×1 IMPLANT
DRAPE SHEET LG 3/4 BI-LAMINATE (DRAPES) ×1 IMPLANT
DRAPE SURG 17X11 SM STRL (DRAPES) ×1 IMPLANT
DRAPE SURG ORHT 6 SPLT 77X108 (DRAPES) ×2 IMPLANT
DRAPE TOP 10253 STERILE (DRAPES) ×1 IMPLANT
DRAPE U-SHAPE 47X51 STRL (DRAPES) ×1 IMPLANT
DRSG AQUACEL AG ADV 3.5X 6 (GAUZE/BANDAGES/DRESSINGS) ×1 IMPLANT
DURAPREP 26ML APPLICATOR (WOUND CARE) ×2 IMPLANT
ELECT BLADE TIP CTD 4 INCH (ELECTRODE) ×1 IMPLANT
ELECT PENCIL ROCKER SW 15FT (MISCELLANEOUS) ×1 IMPLANT
ELECT REM PT RETURN 15FT ADLT (MISCELLANEOUS) ×1 IMPLANT
FACESHIELD WRAPAROUND OR TEAM (MASK) ×1 IMPLANT
GLOVE BIO SURGEON STRL SZ7.5 (GLOVE) ×1 IMPLANT
GLOVE BIOGEL PI IND STRL 6.5 (GLOVE) ×1 IMPLANT
GLOVE BIOGEL PI IND STRL 8 (GLOVE) ×1 IMPLANT
GLOVE SURG SS PI 6.5 STRL IVOR (GLOVE) ×1 IMPLANT
GOWN STRL REUS W/ TWL LRG LVL3 (GOWN DISPOSABLE) ×1 IMPLANT
GOWN STRL REUS W/ TWL XL LVL3 (GOWN DISPOSABLE) ×1 IMPLANT
HOOD PEEL AWAY T7 (MISCELLANEOUS) ×3 IMPLANT
INSERT SMALL SOCKET 32MM NEU (Insert) IMPLANT
KIT BASIN OR (CUSTOM PROCEDURE TRAY) ×1 IMPLANT
KIT TURNOVER KIT A (KITS) ×1 IMPLANT
MANIFOLD NEPTUNE II (INSTRUMENTS) ×1 IMPLANT
NDL TROCAR POINT SZ 2 1/2 (NEEDLE) IMPLANT
NEEDLE TROCAR POINT SZ 2 1/2 (NEEDLE) IMPLANT
NS IRRIG 1000ML POUR BTL (IV SOLUTION) ×1 IMPLANT
PACK SHOULDER (CUSTOM PROCEDURE TRAY) ×1 IMPLANT
PAD COLD SHLDR WRAP-ON (PAD) ×1 IMPLANT
RESTRAINT HEAD UNIVERSAL NS (MISCELLANEOUS) ×1 IMPLANT
RETRIEVER SUT HEWSON (MISCELLANEOUS) IMPLANT
SCREW BONE RSP LOCK 5X14 (Screw) IMPLANT
SCREW BONE RSP LOCK 5X26 (Screw) IMPLANT
SCREW BONE RSP LOCK 5X30 (Screw) IMPLANT
SCREW RETAIN W/HEAD 4MM OFFSET (Shoulder) IMPLANT
SET HNDPC FAN SPRY TIP SCT (DISPOSABLE) ×1 IMPLANT
SLING ARM FOAM STRAP LRG (SOFTGOODS) IMPLANT
SLING ARM FOAM STRAP MED (SOFTGOODS) IMPLANT
STEM HUMERAL 8X48 SHOULDER (Miscellaneous) IMPLANT
STRIP CLOSURE SKIN 1/2X4 (GAUZE/BANDAGES/DRESSINGS) ×1 IMPLANT
SUCTION TUBE FRAZIER 10FR DISP (SUCTIONS) IMPLANT
SUPPORT WRAP ARM LG (MISCELLANEOUS) ×1 IMPLANT
SUT ETHIBOND 2 V 37 (SUTURE) IMPLANT
SUT MNCRL AB 4-0 PS2 18 (SUTURE) ×1 IMPLANT
SUT VIC AB 2-0 CT1 TAPERPNT 27 (SUTURE) ×2 IMPLANT
SUTURE FIBERWR#2 38 REV NDL BL (SUTURE) IMPLANT
TAP SURG THRD DJ 6.5 (ORTHOPEDIC DISPOSABLE SUPPLIES) IMPLANT
TAPE LABRALWHITE 1.5X36 (TAPE) IMPLANT
TAPE SUT LABRALTAP WHT/BLK (SUTURE) IMPLANT
TOWEL OR 17X26 10 PK STRL BLUE (TOWEL DISPOSABLE) ×1 IMPLANT
TUBE SUCTION HIGH CAP CLEAR NV (SUCTIONS) ×1 IMPLANT
WATER STERILE IRR 1000ML POUR (IV SOLUTION) ×1 IMPLANT

## 2024-04-17 NOTE — Discharge Instructions (Addendum)
 Discharge Instructions after Reverse Total Shoulder Arthroplasty   A sling has been provided for you. You are to wear this at all times (except for bathing and dressing), until your first post operative visit with Dr. Ave Filter. Please also wear while sleeping at night. While you bath and dress, let the arm/elbow extend straight down to stretch your elbow. Wiggle your fingers and pump your first while your in the sling to prevent hand swelling. Use ice on the shoulder intermittently over the first 48 hours after surgery. Continue to use ice or and ice machine as needed after 48 hours for pain control/swelling.  Pain medicine has been prescribed for you.  Use your medicine liberally over the first 48 hours, and then you can begin to taper your use. You may take Extra Strength Tylenol or Tylenol only in place of the pain pills. DO NOT take ANY nonsteroidal anti-inflammatory pain medications: Advil, Motrin, Ibuprofen, Aleve, Naproxen or Naprosyn.  Take one aspirin a day for 2 weeks after surgery, unless you have an aspirin sensitivity/allergy or asthma.  Leave your dressing on until your first follow up visit.  You may shower with the dressing.  Hold your arm as if you still have your sling on while you shower. Simply allow the water to wash over the site and then pat dry. Make sure your axilla (armpit) is completely dry after showering.    Please call [phone] during normal business hours or [phone] after hours for any problems. Including the following:  - excessive redness of the incisions - drainage for more than 4 days - fever of more than 101.5 F  *Please note that pain medications will not be refilled after hours or on weekends.    Dental Antibiotics:  In most cases prophylactic antibiotics for Dental procdeures after total joint surgery are not necessary.  Exceptions are as follows:  1. History of prior total joint infection  2. Severely immunocompromised (Organ Transplant,  cancer chemotherapy, Rheumatoid biologic meds such as Humera)  3. Poorly controlled diabetes (A1C &gt; 8.0, blood glucose over 200)  If you have one of these conditions, contact your surgeon for an antibiotic prescription, prior to your dental procedure.

## 2024-04-17 NOTE — Anesthesia Procedure Notes (Signed)
 Procedure Name: Intubation Date/Time: 04/17/2024 9:28 AM  Performed by: Augusta Daved SAILOR, CRNAPre-anesthesia Checklist: Patient identified, Emergency Drugs available, Suction available and Patient being monitored Patient Re-evaluated:Patient Re-evaluated prior to induction Oxygen Delivery Method: Circle System Utilized Preoxygenation: Pre-oxygenation with 100% oxygen Induction Type: IV induction Ventilation: Mask ventilation without difficulty and Oral airway inserted - appropriate to patient size Laryngoscope Size: Cleotilde and 2 Grade View: Grade II Tube type: Oral Tube size: 7.0 mm Number of attempts: 1 Airway Equipment and Method: Stylet and Oral airway Placement Confirmation: ETT inserted through vocal cords under direct vision, positive ETCO2 and breath sounds checked- equal and bilateral Secured at: 22 (at the lip) cm Tube secured with: Tape Dental Injury: Teeth and Oropharynx as per pre-operative assessment

## 2024-04-17 NOTE — Anesthesia Procedure Notes (Signed)
 Anesthesia Regional Block: Interscalene brachial plexus block   Pre-Anesthetic Checklist: , timeout performed,  Correct Patient, Correct Site, Correct Laterality,  Correct Procedure, Correct Position, site marked,  Risks and benefits discussed,  Pre-op  evaluation,  At surgeon's request and post-op pain management  Laterality: Right  Prep: Maximum Sterile Barrier Precautions used, chloraprep       Needles:  Injection technique: Single-shot  Needle Type: Echogenic Stimulator Needle     Needle Length: 5cm  Needle Gauge: 22     Additional Needles:   Procedures:, nerve stimulator,,, ultrasound used (permanent image in chart),,     Nerve Stimulator or Paresthesia:  Response: Biceps response  Additional Responses:   Narrative:  Start time: 04/17/2024 9:10 AM End time: 04/17/2024 9:15 AM Injection made incrementally with aspirations every 5 mL.  Performed by: Personally  Anesthesiologist: Keneth Lynwood POUR, MD

## 2024-04-17 NOTE — Op Note (Signed)
 Procedure(s): RIGHT REVERSE TOTAL SHOULDER ARTHROPLASTY Procedure Note  Terri Daniel female 68 y.o. 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 female  with irrepairable rotator cuff tear and arthropathy. Pain and dysfunction interfered with quality of life and nonoperative treatment with activity modification, NSAIDS and injections failed.     Surgeon: Josefa LELON Herring   Assistants: Jeoffrey Northern PA-C Amber was present and scrubbed throughout the procedure and was essential in positioning, retraction, exposure, and closure)  Anesthesia: General endotracheal anesthesia with preoperative interscalene block given by the attending anesthesiologist     Procedure Detail  RIGHT REVERSE TOTAL SHOULDER ARTHROPLASTY   Estimated Blood Loss:  200 mL         Drains: none  Blood Given: none          Specimens: none        Complications:  * No complications entered in OR log *         Disposition: PACU - hemodynamically stable.         Condition: stable      OPERATIVE FINDINGS:  A DJO Altivate pressfit reverse total shoulder arthroplasty was placed with a  size 8 stem, a 32-4 glenosphere, and a standard-mm poly insert. The base plate  fixation was excellent.  PROCEDURE: The patient was identified in the preoperative holding area  where I personally marked the operative site after verifying site, side,  and procedure with the patient. An interscalene block given by  the attending anesthesiologist in the holding area and the patient was taken back to the operating room where all extremities were  carefully padded in position after general anesthesia was induced. She  was placed in a beach-chair position and the operative upper extremity was  prepped and draped in  a standard sterile fashion. An approximately 10-  cm incision was made from the tip of the coracoid process to the center  point of the humerus at the level of the axilla. Dissection was carried  down through subcutaneous tissues to the level of the cephalic vein  which was taken laterally with the deltoid. The pectoralis major was  retracted medially. The subdeltoid space was developed and the lateral  edge of the conjoined tendon was identified. The undersurface of  conjoined tendon was palpated and the musculocutaneous nerve was not in  the field. Retractor was placed underneath the conjoined and second  retractor was placed lateral into the deltoid. The circumflex humeral  artery and vessels were identified and clamped and coagulated. The  biceps tendon was absent.  The subscapularis was chronically torn and taken down as a peel.  The  joint was then gently externally rotated while the capsule was released  from the humeral neck around to just beyond the 6 o'clock position. At  this point, the joint was dislocated and the humeral head was presented  into the wound. The excessive osteophyte formation was removed with a  large rongeur.  The cutting guide was used to make the appropriate  head cut and the head was saved for potentially bone grafting.  The glenoid was exposed with the arm in an  abducted extended position. The anterior and posterior labrum were  completely excised and the capsule was released circumferentially to  allow for exposure of the glenoid for preparation. The 2.5 mm  drill was  placed using the guide in 5-10 inferior angulation and the tap was then advanced in the same hole. Small and large reamers were then used. The tap was then removed and the Metaglene was then screwed in with excellent purchase.  The peripheral guide was then used to drilled measured and filled peripheral locking screws. The size 32-4 glenosphere was then impacted on the [facility] taper and the central  screw was placed. The humerus was then again exposed and the diaphyseal reamers were used followed by the metaphyseal reamers. The final broach was left in place in the proximal trial was placed. The joint was reduced and with this implant it was felt that soft tissue tensioning was appropriate with excellent stability and excellent range of motion. Therefore, final humeral stem was placed press-fit.  And then the trial polyethylene inserts were tested again and the above implant was felt to be the most appropriate for final insertion. The joint was reduced taken through full range of motion and felt to be stable. Soft tissue tension was appropriate.  The joint was then copiously irrigated with pulse  lavage and the wound was then closed. The subscapularis was not repaired.  Skin was closed with 2-0 Vicryl in a deep dermal layer and 4-0  Monocryl for skin closure. Steri-Strips were applied. Sterile  dressings were then applied as well as a sling. The patient was allowed  to awaken from general anesthesia, transferred to stretcher, and taken  to recovery room in stable condition.   POSTOPERATIVE PLAN: The patient will be observed in the recovery room and if pain is well-controlled with regional anesthesia and she is hemodynamically stable she can be discharged home with family.

## 2024-04-17 NOTE — Anesthesia Postprocedure Evaluation (Signed)
 Anesthesia Post Note  Patient: Terri Daniel  Procedure(s) Performed: RIGHT REVERSE TOTAL SHOULDER ARTHROPLASTY (Right: Shoulder)     Patient location during evaluation: PACU Anesthesia Type: General Level of consciousness: awake and alert Pain management: pain level controlled Vital Signs Assessment: post-procedure vital signs reviewed and stable Respiratory status: spontaneous breathing, nonlabored ventilation, respiratory function stable and patient connected to nasal cannula oxygen Cardiovascular status: blood pressure returned to baseline and stable Postop Assessment: no apparent nausea or vomiting Anesthetic complications: no   No notable events documented.  Last Vitals:  Vitals:   04/17/24 1156 04/17/24 1200  BP:    Pulse:    Resp: 16 18  Temp: 36.5 C 36.5 C  SpO2:      Last Pain:  Vitals:   04/17/24 1200  TempSrc:   PainSc: 0-No pain                 Lynwood MARLA Cornea

## 2024-04-17 NOTE — Evaluation (Signed)
 Occupational Therapy Evaluation Patient Details Name: Terri Daniel MRN: [id number] DOB: 20-Aug-1956 Today's Date: 04/17/2024   History of Present Illness   Pt is a 68 y.o. female s/p R reverse total shoulder arthroplasty on 04/17/24. PMH significant for GERD, glaucoma, HLD, HTN, L TKA, L heart cath, hysterectomy.     Clinical Impressions Prior to admission, pt lives alone. Daughter will be providing 24/7 support and assist at discharge. Education completed regarding compensatory strategies for ADL tasks and functional mobility, management of sling, hand / elbow ROM per specified parameters in the order set as indicated below, positioning of operative arm in sitting and supine and edema control, including use of Iceman Cold Therapy machine. Caregiver present for education, written handouts provided and reviewed using teach back and pt/caregiver verbalized/demonstrated understanding. Due to the below listed deficits, pt requires MIN assistance with ADL tasks and +1 HHA assist with functional mobility. Completed UB/LB dressing with caregiver independent in assisting, OT educated on stair training to prep for navigating in-home entry. Pt to follow up with MD to progress rehab of the operative shoulder.      If plan is discharge home, recommend the following:   A little help with walking and/or transfers;A little help with bathing/dressing/bathroom;Assist for transportation;Help with stairs or ramp for entrance;Supervision due to cognitive status     Functional Status Assessment   Patient has had a recent decline in their functional status and demonstrates the ability to make significant improvements in function in a reasonable and predictable amount of time.     Equipment Recommendations   None recommended by OT      Precautions/Restrictions   Precautions Precautions: Shoulder;Sternal Shoulder Interventions: Shoulder sling/immobilizer;Off for  dressing/bathing/exercises Precaution Booklet Issued: Yes (comment) Recall of Precautions/Restrictions: Intact Required Braces or Orthoses: Sling Restrictions Weight Bearing Restrictions Per Provider Order: Yes RUE Weight Bearing Per Provider Order: Non weight bearing  No shoulder ROM allowed; elbow/wrist/hand AROM only.     Mobility Bed Mobility                    Transfers Overall transfer level: Needs assistance   Transfers: Sit to/from Stand Sit to Stand: Contact guard assist           General transfer comment: pt performs multiple STS with daughter providing +1 HHA for stability      Balance Overall balance assessment: Mild deficits observed, not formally tested                                         ADL either performed or assessed with clinical judgement   ADL Overall ADL's : Needs assistance/impaired                                        Per orders, no shoulder ROM; elbow/wrist/hand ROM only. While moving within specified parameters, pt/caregiver instructed on bathing and how to donn/doff shirt, placing operative arm through sleeve first when donning and off last when doffing. Pt/caregiver educated on compensatory strategies for LB ADL and strategies to reduce risk of falls.  Pt/caregiver educated on donning/doffing sling and to wear the sling at all times with the exception of ADL, and to loosen the neck strap of the sling when the operative arm is in a supported position when sitting. In sitting  or supine, pt instructed to have a pillow behind and under their operative arm to provide support. If assist needed with ambulation, caregiver educated on the importance of walking on pt's non-operative side.  Education regarding use of IceMan Cold Therapy completed, including the importance of using a barrier on the shoulder prior to positioning the wrap-on pad. Pt/caregiver verbalized/demonstrated understanding. Teach Back used  while caregiver assisted with dressing pt and positioning wrap-on pad to facilitate DC.      Vision Baseline Vision/History: 0 No visual deficits Ability to See in Adequate Light: 0 Adequate              Pertinent Vitals/Pain Pain Assessment Pain Assessment: No/denies pain     Extremity/Trunk Assessment Upper Extremity Assessment Upper Extremity Assessment: Right hand dominant;RUE deficits/detail RUE: Unable to fully assess due to immobilization   Lower Extremity Assessment Lower Extremity Assessment: Overall WFL for tasks assessed   Cervical / Trunk Assessment Cervical / Trunk Assessment: Normal   Communication Communication Communication: No apparent difficulties   Cognition Arousal: Alert Behavior During Therapy: WFL for tasks assessed/performed Cognition: No apparent impairments                               Following commands: Intact       Cueing  General Comments   Cueing Techniques: Verbal cues  Daughter present for caregiver education and training. Will be providing 24/7 at discharge.   Exercises Exercises: Shoulder, Other exercises Other Exercises Other Exercises: pt participated in functional hallway mobility with daughter providing HHA. pt navigated 4 stairs using L hand rail with daughter assisting.   Shoulder Instructions Shoulder Instructions Donning/doffing shirt without moving shoulder: Caregiver independent with task;Minimal assistance Method for sponge bathing under operated UE: Caregiver independent with task;Minimal assistance Donning/doffing sling/immobilizer: Minimal assistance;Caregiver independent with task Correct positioning of sling/immobilizer: Caregiver independent with task;Maximal assistance ROM for elbow, wrist and digits of operated UE: Caregiver independent with task;Minimal assistance Sling wearing schedule (on at all times/off for ADL's): Caregiver independent with task Proper positioning of operated UE when  showering: Caregiver independent with task Positioning of UE while sleeping: Caregiver independent with task    Home Living Family/patient expects to be discharged to:: Private residence Living Arrangements: Alone Available Help at Discharge: Family;Available PRN/intermittently (daughter will be staying with her for the first few days after sx) Type of Home: House Home Access: Stairs to enter [employer] of Steps: 2 Entrance Stairs-Rails: Left Home Layout: One level     Bathroom Shower/Tub: Producer, television/film/video: Standard     Home Equipment: [employer] - single point          Prior Functioning/Environment Prior Level of Function : Independent/Modified Independent;Driving                    OT Problem List: Decreased range of motion;Decreased strength;Decreased activity tolerance;Decreased knowledge of precautions;Decreased knowledge of use of DME or AE        OT Goals(Current goals can be found in the care plan section)   Acute Rehab OT Goals OT Goal Formulation: All assessment and education complete, DC therapy   OT Frequency:       Co-evaluation              AM-PAC OT 6 Clicks Daily Activity     Outcome Measure Help from another person eating meals?: None Help from another person taking care of personal grooming?:  None Help from another person toileting, which includes using toliet, bedpan, or urinal?: A Little Help from another person bathing (including washing, rinsing, drying)?: A Little Help from another person to put on and taking off regular upper body clothing?: A Little Help from another person to put on and taking off regular lower body clothing?: None 6 Click Score: 21   End of Session Equipment Utilized During Treatment: Other (comment) (sling) Nurse Communication: Mobility status (ADL edu complete)  Activity Tolerance: Patient tolerated treatment well;No increased pain Patient left: in chair;with call bell/phone  within reach;with family/visitor present;with nursing/sitter in room  OT Visit Diagnosis: Other abnormalities of gait and mobility (R26.89)                Time: 8779-8744 OT Time Calculation (min): 35 min Charges:  OT General Charges $OT Visit: 1 Visit OT Treatments $Self Care/Home Management : 23-37 mins  Malakhai Beitler L. Janie Strothman, OTR/L  04/17/24, 1:24 PM

## 2024-04-17 NOTE — Transfer of Care (Signed)
 Immediate Anesthesia Transfer of Care Note  Patient: Terri Daniel  Procedure(s) Performed: RIGHT REVERSE TOTAL SHOULDER ARTHROPLASTY (Right: Shoulder)  Patient Location: PACU  Anesthesia Type:General  Level of Consciousness: oriented, drowsy, and patient cooperative  Airway & Oxygen Therapy: Patient Spontanous Breathing and Patient connected to face mask oxygen  Post-op Assessment: Report given to RN and Post -op Vital signs reviewed and stable  Post vital signs: Reviewed and stable  Last Vitals:  Vitals Value Taken Time  BP 160/94 04/17/24 10:38  Temp    Pulse 87 04/17/24 10:39  Resp 14 04/17/24 10:39  SpO2 100 % 04/17/24 10:39  Vitals shown include unfiled device data.  Last Pain:  Vitals:   04/17/24 0825  TempSrc:   PainSc: 0-No pain         Complications: No notable events documented.

## 2024-04-17 NOTE — H&P (Signed)
 Terri Daniel is an 68 y.o. female.   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  Failed conservative management.  Past Medical History:  Diagnosis Date   Anxiety    Arthritis    Asthma    Bulging lumbar disc    Chronic interstitial cystitis    Complication of anesthesia    Slow to wake up, difficult to numb   Depression    Diabetes mellitus without complication (HCC)    Disorder of left rotator cuff    Family history of adverse reaction to anesthesia    Mother hard to wake up   Fibromyalgia    GERD (gastroesophageal reflux disease)    Glaucoma    HLD (hyperlipidemia)    Hypertension    Hypertensive retinopathy    Tobacco abuse    Transaminitis     Past Surgical History:  Procedure Laterality Date   ABDOMINAL HYSTERECTOMY     BLEPHAROPLASTY Bilateral    BREAST BIOPSY Left    BREAST CYST EXCISION Right    CATARACT EXTRACTION     CYSTO     CYSTOSCOPY     EYE SURGERY  12/2023   EYE SURGERY Left 09/2023   hydrodistention     with botox   JOINT REPLACEMENT Left 2009   Knee   LEFT HEART CATH AND CORONARY ANGIOGRAPHY N/A 02/07/2017   Procedure: Left Heart Cath and Coronary Angiography;  Surgeon: Burnard Debby LABOR, MD;  Location: [facility] INVASIVE CV LAB;  Service: Cardiovascular;  Laterality: N/A;   TUBAL LIGATION     ULNAR NERVE REPAIR      Family History  Problem Relation Age of Onset   CAD Mother        stent in her 60s   Atrial fibrillation Mother    CAD Father        CABG age 31   Diabetes Father    Kidney disease Sister    CAD Brother        widow maker MI before age 81 requiring stent   Diabetes Mellitus II Brother    Social History:  reports that she has been smoking cigarettes. She has never used smokeless tobacco. She reports that she does not drink alcohol and does not use drugs.  Allergies:  Allergies  Allergen Reactions   Propoxyphene Shortness Of Breath   Doxycycline Nausea And Vomiting    Erythromycin Base Rash   Statins Other (See Comments)    Severe cramps   Sulfa Antibiotics Rash   Contrast Media [Iodinated Contrast Media] Hives    CT in 2004   Prednisone Rash    Heart pounding, rash     Medications Prior to Admission  Medication Sig Dispense Refill   ACCU-CHEK GUIDE test strip USE DAILY AS DIRECTED 100 strip 3   amLODipine (NORVASC) 2.5 MG tablet Take 2.5 mg by mouth daily.     Ascorbic Acid (VITAMIN C PO) Take 1 tablet by mouth daily.     bimatoprost (LUMIGAN) 0.01 % SOLN Place 1 drop into both eyes at bedtime.     CALCIUM  PO Take 1 tablet by mouth daily.     Camphor-Menthol-Methyl Sal (SALONPAS) 3.09-09-08 % PTCH Place 1 patch onto the skin daily as needed (pain).     diclofenac sodium (VOLTAREN) 1 % GEL Place 1 application  onto the skin 2 (two) times daily as needed (pain).     ibuprofen (ADVIL) 200 MG tablet Take 600 mg by mouth  every 6 (six) hours as needed for moderate pain (pain score 4-6).     lisinopril-hydrochlorothiazide (PRINZIDE,ZESTORETIC) 20-12.5 MG tablet Take 2 tablets by mouth daily.     MAGNESIUM PO Take 1 tablet by mouth daily.     meloxicam (MOBIC) 15 MG tablet Take 15 mg by mouth daily.     MOUNJARO 7.5 MG/0.5ML Pen Inject 7.5 mg into the skin every Thursday.     Multiple Vitamin (MULTIVITAMIN WITH MINERALS) TABS tablet Take 1 tablet by mouth daily.     NON FORMULARY Pt uses a cpap nightly     Oxcarbazepine (TRILEPTAL) 300 MG tablet Take 300 mg by mouth 2 (two) times daily.     SYSTANE ULTRA 0.4-0.3 % SOLN Place 1 drop into both eyes daily as needed (dry eyes).     tamsulosin (FLOMAX) 0.4 MG CAPS capsule Take 0.4 mg by mouth daily.     tiZANidine  (ZANAFLEX ) 4 MG tablet Take 4 mg by mouth at bedtime.     Vilazodone HCl (VIIBRYD) 10 MG TABS Take 10 mg by mouth daily.     albuterol (PROVENTIL HFA;VENTOLIN HFA) 108 (90 Base) MCG/ACT inhaler Inhale 1-2 puffs into the lungs every 6 (six) hours as needed for wheezing or shortness of breath.      ezetimibe  (ZETIA ) 10 MG tablet Take 1 tablet (10 mg total) by mouth daily. (Patient not taking: Reported on 03/19/2024) 30 tablet 1   hydrOXYzine (ATARAX/VISTARIL) 25 MG tablet Take 25 mg by mouth daily as needed for anxiety.     hyoscyamine (LEVBID) 0.375 MG 12 hr tablet Take 0.375 mg by mouth every 12 (twelve) hours as needed for cramping.     omeprazole (PRILOSEC) 40 MG capsule Take 40 mg by mouth daily as needed (acid reflux).      Results for orders placed or performed during the hospital encounter of 04/17/24 (from the past 48 hours)  Glucose, capillary     Status: Abnormal   Collection Time: 04/17/24  8:11 AM  Result Value Ref Range   Glucose-Capillary 104 (H) 70 - 99 mg/dL    Comment: Glucose reference range applies only to samples taken after fasting for at least 8 hours.   Comment 1 Notify RN    Comment 2 Document in Chart   I-STAT, chem 8     Status: Abnormal   Collection Time: 04/17/24  8:22 AM  Result Value Ref Range   Sodium 135 135 - 145 mmol/L   Potassium 3.9 3.5 - 5.1 mmol/L   Chloride 100 98 - 111 mmol/L   BUN 9 8 - 23 mg/dL   Creatinine, Ser 9.19 0.44 - 1.00 mg/dL   Glucose, Bld 86 70 - 99 mg/dL    Comment: Glucose reference range applies only to samples taken after fasting for at least 8 hours.   Calcium , Ion 1.20 1.15 - 1.40 mmol/L   TCO2 23 22 - 32 mmol/L   Hemoglobin 11.9 (L) 12.0 - 15.0 g/dL   HCT 64.9 (L) 63.9 - 53.9 %   No results found.  Review of Systems  All other systems reviewed and are negative.   Blood pressure 137/77, pulse 65, temperature 98.2 F (36.8 C), temperature source Oral, resp. rate 12, height 5' 2 (1.575 m), weight 67 kg, SpO2 99%. Physical Exam HENT:     Head: Atraumatic.  Eyes:     Extraocular Movements: Extraocular movements intact.  Cardiovascular:     Pulses: Normal pulses.  Pulmonary:     Effort: Pulmonary effort  is normal.  Musculoskeletal:     Comments: R shoulder pain with limited ROM> NVID  Neurological:      Mental Status: She is alert.  Psychiatric:        Mood and Affect: Mood normal.      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  Failed conservative management. Plan R reverse TSA Risks / benefits of surgery discussed Consent on chart  NPO for OR Preop antibiotics   Josefa LELON Herring, MD 04/17/2024, 8:32 AM

## 2024-04-18 ENCOUNTER — Encounter (HOSPITAL_COMMUNITY): Payer: Self-pay | Admitting: Orthopedic Surgery

## 2024-05-06 NOTE — Addendum Note (Signed)
 Addendum  created 05/06/24 1229 by Augusta Daved SAILOR, CRNA   Intraprocedure Event edited

## 2024-06-03 ENCOUNTER — Institutional Professional Consult (permissible substitution) (HOSPITAL_BASED_OUTPATIENT_CLINIC_OR_DEPARTMENT_OTHER): Admitting: Internal Medicine

## 2024-06-11 ENCOUNTER — Ambulatory Visit: Admitting: Family

## 2024-06-23 NOTE — Progress Notes (Deleted)
 Cardiology Office Note:    Date:  06/23/2024   ID:  Terri Daniel, DOB 1956/02/19, MRN [id number]  PCP:  Arloa Jarvis, NP   [facility] Providers Cardiologist:  None { Click to update primary MD,subspecialty MD or APP then REFRESH:1}    Referring MD: Dreama Longs, MD   No chief complaint on file. ***  History of Present Illness:    Terri Daniel is a 68 y.o. female is seen at the request of Dr Dreama for evaluation of chest pain. She has a history of DM, HTN, HLD and tobacco abuse. She  had prior cardiac evaluation in 2018. Echo was normal. Cardiac cath demonstrated minimal nonobstructive CAD.   Past Medical History:  Diagnosis Date   Anxiety    Arthritis    Asthma    Bulging lumbar disc    Chronic interstitial cystitis    Complication of anesthesia    Slow to wake up, difficult to numb   Depression    Diabetes mellitus without complication (HCC)    Disorder of left rotator cuff    Family history of adverse reaction to anesthesia    Mother hard to wake up   Fibromyalgia    GERD (gastroesophageal reflux disease)    Glaucoma    HLD (hyperlipidemia)    Hypertension    Hypertensive retinopathy    Tobacco abuse    Transaminitis     Past Surgical History:  Procedure Laterality Date   ABDOMINAL HYSTERECTOMY     BLEPHAROPLASTY Bilateral    BREAST BIOPSY Left    BREAST CYST EXCISION Right    CATARACT EXTRACTION     CYSTO     CYSTOSCOPY     EYE SURGERY  12/2023   EYE SURGERY Left 09/2023   hydrodistention     with botox   JOINT REPLACEMENT Left 2009   Knee   LEFT HEART CATH AND CORONARY ANGIOGRAPHY N/A 02/07/2017   Procedure: Left Heart Cath and Coronary Angiography;  Surgeon: Burnard Debby LABOR, MD;  Location: [facility] INVASIVE CV LAB;  Service: Cardiovascular;  Laterality: N/A;   REVERSE SHOULDER ARTHROPLASTY Right 04/17/2024   Procedure: RIGHT REVERSE TOTAL SHOULDER ARTHROPLASTY;  Surgeon: Dozier Soulier, MD;  Location: [facility]S;  Service:  Orthopedics;  Laterality: Right;   TUBAL LIGATION     ULNAR NERVE REPAIR      Current Medications: No outpatient medications have been marked as taking for the 07/07/24 encounter (Appointment) with [place], Tery Hoeger M, MD.     Allergies:   Propoxyphene, Doxycycline, Erythromycin base, Statins, Sulfa antibiotics, Contrast media [iodinated contrast media], and Prednisone   Social History   Socioeconomic History   Marital status: Divorced    Spouse name: Not on file   Number of children: 2   Years of education: 16   Highest education level: Bachelor's degree (e.g., BA, AB, BS)  Occupational History   Occupation: disabled  Tobacco Use   Smoking status: Every Day    Current packs/day: 0.50    Types: Cigarettes   Smokeless tobacco: Never   Tobacco comments:    27 years total as of 2018  Vaping Use   Vaping status: Never Used  Substance and Sexual Activity   Alcohol use: No   Drug use: No   Sexual activity: Not on file  Other Topics Concern   Not on file  Social History Narrative   Lives alone in a one story home.  Has 2 children.  On disability due to interstitial cystitis.  Education: college.    Social Drivers of Corporate investment banker Strain: Low Risk  (02/04/2024)   Overall Financial Resource Strain (CARDIA)    Difficulty of Paying Living Expenses: Not very hard  Food Insecurity: No Food Insecurity (02/04/2024)   Hunger Vital Sign    Worried About Running Out of Food in the Last Year: Never true    Ran Out of Food in the Last Year: Never true  Transportation Needs: No Transportation Needs (02/04/2024)   PRAPARE - Administrator, Civil Service (Medical): No    Lack of Transportation (Non-Medical): No  Physical Activity: Sufficiently Active (02/04/2024)   Exercise Vital Sign    Days of Exercise per Week: 5 days    Minutes of Exercise per Session: 30 min  Stress: No Stress Concern Present (02/04/2024)   [employer] of Occupational Health - Occupational  Stress Questionnaire    Feeling of Stress : Not at all  Social Connections: Moderately Integrated (02/04/2024)   Social Connection and Isolation Panel    Frequency of Communication with Friends and Family: More than three times a week    Frequency of Social Gatherings with Friends and Family: Once a week    Attends Religious Services: More than 4 times per year    Active Member of [employer] or Organizations: Yes    Attends Engineer, structural: More than 4 times per year    Marital Status: Divorced     Family History: The patient's ***family history includes Atrial fibrillation in her mother; CAD in her brother, father, and mother; Diabetes in her father; Diabetes Mellitus II in her brother; Kidney disease in her sister.  ROS:   Please see the history of present illness.    *** All other systems reviewed and are negative.  EKGs/Labs/Other Studies Reviewed:    The following studies were reviewed today: ***      Recent Labs: 02/08/2024: ALT 20; TSH 1.14 03/24/2024: Platelets 418 04/17/2024: BUN 9; Creatinine, Ser 0.80; Hemoglobin 11.9; Potassium 3.9; Sodium 135  Recent Lipid Panel    Component Value Date/Time   CHOL 254 (H) 02/08/2024 0835   TRIG 124 02/08/2024 0835   HDL 65 02/08/2024 0835   CHOLHDL 3.9 02/08/2024 0835   VLDL 49.2 (H) 06/18/2020 1548   LDLCALC 164 (H) 02/08/2024 0835   LDLDIRECT 182.0 06/18/2020 1548     Risk Assessment/Calculations:   {Does this patient have ATRIAL FIBRILLATION?:[id number]}  No BP recorded.  {Refresh Note OR Click here to enter BP  :1}***         Physical Exam:    VS:  There were no vitals taken for this visit.    Wt Readings from Last 3 Encounters:  04/17/24 147 lb 11.3 oz (67 kg)  03/24/24 149 lb 3.2 oz (67.7 kg)  02/07/24 157 lb 6.4 oz (71.4 kg)     GEN: *** Well nourished, well developed in no acute distress HEENT: Normal NECK: No JVD; No carotid bruits LYMPHATICS: No lymphadenopathy CARDIAC: ***RRR, no murmurs, rubs,  gallops RESPIRATORY:  Clear to auscultation without rales, wheezing or rhonchi  ABDOMEN: Soft, non-tender, non-distended MUSCULOSKELETAL:  No edema; No deformity  SKIN: Warm and dry NEUROLOGIC:  Alert and oriented x 3 PSYCHIATRIC:  Normal affect   ASSESSMENT:    No diagnosis found. PLAN:    In order of problems listed above:  ***      {Are you ordering a CV Procedure (e.g. stress test, cath, DCCV, TEE, etc)?  Press F2        :[id number]}    Medication Adjustments/Labs and Tests Ordered: Current medicines are reviewed at length with the patient today.  Concerns regarding medicines are outlined above.  No orders of the defined types were placed in this encounter.  No orders of the defined types were placed in this encounter.   There are no Patient Instructions on file for this visit.   Signed, Deshundra Waller [place], MD  06/23/2024 12:50 PM    [facility]

## 2024-07-07 ENCOUNTER — Ambulatory Visit: Admitting: Cardiology

## 2024-08-10 LAB — COLOGUARD: COLOGUARD: NEGATIVE

## 2024-09-11 ENCOUNTER — Encounter (HOSPITAL_BASED_OUTPATIENT_CLINIC_OR_DEPARTMENT_OTHER): Payer: Self-pay | Admitting: Internal Medicine

## 2024-09-11 ENCOUNTER — Ambulatory Visit (HOSPITAL_BASED_OUTPATIENT_CLINIC_OR_DEPARTMENT_OTHER): Admitting: Internal Medicine

## 2024-09-11 VITALS — BP 142/72 | HR 68 | Ht 62.0 in | Wt 142.5 lb

## 2024-09-11 DIAGNOSIS — I2510 Atherosclerotic heart disease of native coronary artery without angina pectoris: Secondary | ICD-10-CM | POA: Diagnosis not present

## 2024-09-11 DIAGNOSIS — E785 Hyperlipidemia, unspecified: Secondary | ICD-10-CM | POA: Diagnosis not present

## 2024-09-11 DIAGNOSIS — M791 Myalgia, unspecified site: Secondary | ICD-10-CM

## 2024-09-11 DIAGNOSIS — T466X5D Adverse effect of antihyperlipidemic and antiarteriosclerotic drugs, subsequent encounter: Secondary | ICD-10-CM

## 2024-09-11 NOTE — Progress Notes (Signed)
 "   LIPID CLINIC CONSULT NOTE  Chief Complaint:  Manage dyslipidemia  Primary Care Physician: Daniel Jarvis, NP  Primary Cardiologist:  None  HPI:  Terri Daniel is a 69 y.o. female who is being seen today for the evaluation of dyslipidemia at the request of Terri Arloa, NP. This is a pleasant 69 year old female who previously saw cardiology around 2018.  She has a strong family history of heart disease and a number of family members.  She underwent a heart catheterization which showed minimal nonobstructive coronary disease.  She has had a history of high cholesterol.  Unfortunately she could not tolerate statins.  More recently she was placed on ezetimibe  but had side effects from that.  Ultimately she is established with a new primary care provider at [facility] and was started on Repatha.  She seems to be tolerating this well.  She has only been on it for short period of time and says she has upcoming labs.  Unfortunately she has had a number of other issues including shoulder replacement surgery and recently was told she had 2 herniated disks in her neck.  She is scheduled to have upcoming neck surgery.  She plans to get repeat lipids after these procedures.  She is tolerating the Repatha well though.  PMHx:  Past Medical History:  Diagnosis Date   Anxiety    Arthritis    Asthma    Bulging lumbar disc    Chronic interstitial cystitis    Complication of anesthesia    Slow to wake up, difficult to numb   Depression    Diabetes mellitus without complication (HCC)    Disorder of left rotator cuff    Family history of adverse reaction to anesthesia    Mother hard to wake up   Fibromyalgia    GERD (gastroesophageal reflux disease)    Glaucoma    HLD (hyperlipidemia)    Hypertension    Hypertensive retinopathy    Tobacco abuse    Transaminitis     Past Surgical History:  Procedure Laterality Date   ABDOMINAL HYSTERECTOMY     BLEPHAROPLASTY Bilateral    BREAST  BIOPSY Left    BREAST CYST EXCISION Right    CATARACT EXTRACTION     CYSTO     CYSTOSCOPY     EYE SURGERY  12/2023   EYE SURGERY Left 09/2023   hydrodistention     with botox   JOINT REPLACEMENT Left 2009   Knee   LEFT HEART CATH AND CORONARY ANGIOGRAPHY N/A 02/07/2017   Procedure: Left Heart Cath and Coronary Angiography;  Surgeon: Terri Daniel LABOR, MD;  Location: [facility] INVASIVE CV LAB;  Service: Cardiovascular;  Laterality: N/A;   REVERSE SHOULDER ARTHROPLASTY Right 04/17/2024   Procedure: RIGHT REVERSE TOTAL SHOULDER ARTHROPLASTY;  Surgeon: Terri Soulier, MD;  Location: [facility] ORS;  Service: Orthopedics;  Laterality: Right;   TUBAL LIGATION     ULNAR NERVE REPAIR      FAMHx:  Family History  Problem Relation Age of Onset   CAD Mother        stent in her 51s   Atrial fibrillation Mother    CAD Father        CABG age 78   Diabetes Father    Kidney disease Sister    CAD Brother        widow maker MI before age 38 requiring stent   Diabetes Mellitus II Brother     SOCHx:   reports that she  has been smoking cigarettes. She has never used smokeless tobacco. She reports that she does not drink alcohol and does not use drugs.  ALLERGIES:  Allergies[1]  ROS: Pertinent items noted in HPI and remainder of comprehensive ROS otherwise negative.  HOME MEDS: Medications Ordered Prior to Encounter[2]  LABS/IMAGING: No results found for this or any previous visit (from the past 48 hours). No results found.  LIPID PANEL:    Component Value Date/Time   CHOL 254 (H) 02/08/2024 0835   TRIG 124 02/08/2024 0835   HDL 65 02/08/2024 0835   CHOLHDL 3.9 02/08/2024 0835   VLDL 49.2 (H) 06/18/2020 1548   LDLCALC 164 (H) 02/08/2024 0835   LDLDIRECT 182.0 06/18/2020 1548    No results found for: LIPOA   WEIGHTS: Wt Readings from Last 3 Encounters:  09/11/24 142 lb 8 oz (64.6 kg)  04/17/24 147 lb 11.3 oz (67 kg)  03/24/24 149 lb 3.2 oz (67.7 kg)    VITALS: BP (!) 142/72    Pulse 68   Ht 5' 2 (1.575 m)   Wt 142 lb 8 oz (64.6 kg)   SpO2 98%   BMI 26.06 kg/m   EXAM: Deferred  EKG: Deferred  ASSESSMENT: Dyslipidemia, goal LDL less than 70 History of minimal nonobstructive coronary disease by cath in 2018 Family history of early onset heart disease Statin and ezetimibe  intolerance-cramps/myalgias  PLAN: 1.   Terri Daniel has a dyslipidemia with a target LDL less than 70.  She was started on Repatha which she seems to be tolerating.  Her primary care provider plans to repeat labs in a few months after her surgeries.  I would advise adding LP(a) to that as well.  Although the Repatha is the only therapy that would lower LP(a) if it remains elevated on therapy she might be a candidate for clinical trials or other options.  She wants to be established with cardiology for general follow-up and will arrange for a follow-up with our preventative APP in 1 year.  Thanks again for the kind referral.  Terri Terri Maxcy, MD, [facility], FNLA, FACP  Terri Daniel  [facility]  Medical Director of the Advanced Lipid Disorders &  Cardiovascular Risk Reduction Clinic Diplomate of the American Board of Clinical Lipidology Attending Cardiologist  Direct Dial: [phone]  Fax: [phone]  Website:  [email or web address]  Terri Daniel 09/11/2024, 9:23 AM      [1]  Allergies Allergen Reactions   Propoxyphene Shortness Of Breath   Doxycycline Nausea And Vomiting   Erythromycin Base Rash   Statins Other (See Comments)    Severe cramps   Sulfa Antibiotics Rash   Contrast Media [Iodinated Contrast Media] Hives    CT in 2004   Ezetimibe  Other (See Comments)   Prednisone Rash    Heart pounding, rash   [2]  Current Outpatient Medications on File Prior to Visit  Medication Sig Dispense Refill   ACCU-CHEK GUIDE test strip USE DAILY AS DIRECTED 100 strip 3   albuterol (PROVENTIL HFA;VENTOLIN HFA) 108 (90 Base) MCG/ACT inhaler Inhale 1-2 puffs into the lungs every 6  (six) hours as needed for wheezing or shortness of breath.     amLODipine (NORVASC) 2.5 MG tablet Take 2.5 mg by mouth daily.     Ascorbic Acid (VITAMIN C PO) Take 1 tablet by mouth daily.     bimatoprost (LUMIGAN) 0.01 % SOLN Place 1 drop into both eyes at bedtime.     CALCIUM  PO Take 1 tablet by mouth daily.  diclofenac sodium (VOLTAREN) 1 % GEL Place 1 application  onto the skin 2 (two) times daily as needed (pain).     HYDROcodone-acetaminophen  (NORCO/VICODIN) 5-325 MG tablet TAKE 1 TABLET EVERY 6 HOURS BY ORAL ROUTE AS NEEDED, FOR PAIN.     hydrOXYzine (ATARAX/VISTARIL) 25 MG tablet Take 25 mg by mouth daily as needed for anxiety.     hyoscyamine (LEVBID) 0.375 MG 12 hr tablet Take 0.375 mg by mouth every 12 (twelve) hours as needed for cramping.     lisinopril-hydrochlorothiazide (PRINZIDE,ZESTORETIC) 20-12.5 MG tablet Take 2 tablets by mouth daily.     MAGNESIUM PO Take 1 tablet by mouth daily.     meloxicam (MOBIC) 15 MG tablet Take 15 mg by mouth daily.     MOUNJARO 7.5 MG/0.5ML Pen Inject 7.5 mg into the skin every Thursday.     Multiple Vitamin (MULTIVITAMIN WITH MINERALS) TABS tablet Take 1 tablet by mouth daily.     NON FORMULARY Pt uses a cpap nightly     omeprazole (PRILOSEC) 40 MG capsule Take 40 mg by mouth daily as needed (acid reflux).     Oxcarbazepine (TRILEPTAL) 300 MG tablet Take 300 mg by mouth 2 (two) times daily.     REPATHA SURECLICK 140 MG/ML SOAJ Inject 1 mL into the skin every 14 (fourteen) days.     SYSTANE ULTRA 0.4-0.3 % SOLN Place 1 drop into both eyes daily as needed (dry eyes).     tamsulosin (FLOMAX) 0.4 MG CAPS capsule Take 0.4 mg by mouth daily.     tiZANidine  (ZANAFLEX ) 4 MG tablet Take 1 tablet (4 mg total) by mouth every 8 (eight) hours as needed for muscle spasms. 30 tablet 0   Vilazodone HCl (VIIBRYD) 10 MG TABS Take 10 mg by mouth daily.     Camphor-Menthol-Methyl Sal (SALONPAS) 3.09-09-08 % PTCH Place 1 patch onto the skin daily as needed (pain).  (Patient not taking: Reported on 09/11/2024)     No current facility-administered medications on file prior to visit.   "

## 2024-09-11 NOTE — Patient Instructions (Signed)
 Medication Instructions:  No changes *If you need a refill on your cardiac medications before your next appointment, please call your pharmacy*  Lab Work: none If you have labs (blood work) drawn today and your tests are completely normal, you will receive your results only by: MyChart Message (if you have MyChart) OR A paper copy in the mail If you have any lab test that is abnormal or we need to change your treatment, we will call you to review the results.  Testing/Procedures: none  Follow-Up: At [facility], you and your health needs are our priority.  As part of our continuing mission to provide you with exceptional heart care, our providers are all part of one team.  This team includes your primary Cardiologist (physician) and Advanced Practice Providers or APPs (Physician Assistants and Nurse Practitioners) who all work together to provide you with the care you need, when you need it.  Your next appointment:   12 month(s)  Provider:   Rosaline Bane, NP

## 2024-10-07 ENCOUNTER — Emergency Department (HOSPITAL_COMMUNITY)
Admission: EM | Admit: 2024-10-07 | Discharge: 2024-10-07 | Disposition: A | Discharge status: Home / Self Care | Source: Ambulatory Visit

## 2024-10-07 ENCOUNTER — Other Ambulatory Visit: Payer: Self-pay

## 2024-10-07 DIAGNOSIS — Z79899 Other long term (current) drug therapy: Secondary | ICD-10-CM | POA: Insufficient documentation

## 2024-10-07 DIAGNOSIS — E119 Type 2 diabetes mellitus without complications: Secondary | ICD-10-CM | POA: Insufficient documentation

## 2024-10-07 DIAGNOSIS — Z87891 Personal history of nicotine dependence: Secondary | ICD-10-CM | POA: Insufficient documentation

## 2024-10-07 DIAGNOSIS — E876 Hypokalemia: Secondary | ICD-10-CM | POA: Insufficient documentation

## 2024-10-07 DIAGNOSIS — I480 Paroxysmal atrial fibrillation: Secondary | ICD-10-CM | POA: Insufficient documentation

## 2024-10-07 DIAGNOSIS — I10 Essential (primary) hypertension: Secondary | ICD-10-CM | POA: Insufficient documentation

## 2024-10-07 LAB — CBC WITH DIFFERENTIAL/PLATELET
Abs Immature Granulocytes: 0.02 10*3/uL (ref 0.00–0.07)
Basophils Absolute: 0 10*3/uL (ref 0.0–0.1)
Basophils Relative: 0 %
Eosinophils Absolute: 0.1 10*3/uL (ref 0.0–0.5)
Eosinophils Relative: 1 %
HCT: 38.8 % (ref 36.0–46.0)
Hemoglobin: 13.4 g/dL (ref 12.0–15.0)
Immature Granulocytes: 0 %
Lymphocytes Relative: 33 %
Lymphs Abs: 3.3 10*3/uL (ref 0.7–4.0)
MCH: 30.1 pg (ref 26.0–34.0)
MCHC: 34.5 g/dL (ref 30.0–36.0)
MCV: 87.2 fL (ref 80.0–100.0)
Monocytes Absolute: 0.6 10*3/uL (ref 0.1–1.0)
Monocytes Relative: 6 %
Neutro Abs: 5.8 10*3/uL (ref 1.7–7.7)
Neutrophils Relative %: 60 %
Platelets: 347 10*3/uL (ref 150–400)
RBC: 4.45 MIL/uL (ref 3.87–5.11)
RDW: 13.1 % (ref 11.5–15.5)
Smear Review: NORMAL
WBC: 9.8 10*3/uL (ref 4.0–10.5)
nRBC: 0 % (ref 0.0–0.2)

## 2024-10-07 LAB — BASIC METABOLIC PANEL WITH GFR
Anion gap: 10 (ref 5–15)
BUN: 9 mg/dL (ref 8–23)
CO2: 28 mmol/L (ref 22–32)
Calcium: 9.3 mg/dL (ref 8.9–10.3)
Chloride: 102 mmol/L (ref 98–111)
Creatinine, Ser: 0.54 mg/dL (ref 0.44–1.00)
GFR, Estimated: >60 mL/min
Glucose, Bld: 99 mg/dL (ref 70–99)
Potassium: 3.4 mmol/L — ABNORMAL LOW (ref 3.5–5.1)
Sodium: 139 mmol/L (ref 135–145)

## 2024-10-07 LAB — MAGNESIUM: Magnesium: 2.1 mg/dL (ref 1.7–2.4)

## 2024-10-07 NOTE — ED Provider Notes (Cosign Needed)
 " [facility] Provider Note   CSN: [id number] Arrival date & time: 10/07/24  9056     Patient presents with: No chief complaint on file.   Terri Daniel is a 69 y.o. female with history of tobacco use, diabetes, hypertension, hyperlipidemia who presents to the emergency department today from surgical center after found to be in atrial fibrillation with RVR.  Patient does not have a history of atrial fibrillation.  Patient was going in for a cervical procedure and they gave her some Versed  and noticed that she was in atrial fibrillation with RVR.  Patient was asymptomatic.  She currently is asymptomatic.  Per EMS, she had a rate in the 130s which did show atrial fibrillation.  She was given 800 mL of normal saline prior to arrival here in the ED.  Patient currently denies chest pain, shortness of breath, generalized weakness. No recent illness.    HPI     Prior to Admission medications  Medication Sig Start Date End Date Taking? Authorizing Provider  ACCU-CHEK GUIDE test strip USE DAILY AS DIRECTED 06/20/21   Kassie Mallick, MD  albuterol (PROVENTIL HFA;VENTOLIN HFA) 108 (90 Base) MCG/ACT inhaler Inhale 1-2 puffs into the lungs every 6 (six) hours as needed for wheezing or shortness of breath.    [provider]  amLODipine (NORVASC) 2.5 MG tablet Take 2.5 mg by mouth daily.    [provider]  Ascorbic Acid (VITAMIN C PO) Take 1 tablet by mouth daily.    [provider]  bimatoprost (LUMIGAN) 0.01 % SOLN Place 1 drop into both eyes at bedtime.    [provider]  CALCIUM  PO Take 1 tablet by mouth daily.    [provider]  Camphor-Menthol-Methyl Sal (SALONPAS) 3.09-09-08 % PTCH Place 1 patch onto the skin daily as needed (pain). Patient not taking: Reported on 09/11/2024    [provider]  diclofenac sodium (VOLTAREN) 1 % GEL Place 1 application  onto the skin 2 (two) times daily as needed  (pain).    [provider]  HYDROcodone-acetaminophen  (NORCO/VICODIN) 5-325 MG tablet TAKE 1 TABLET EVERY 6 HOURS BY ORAL ROUTE AS NEEDED, FOR PAIN.    [provider]  hydrOXYzine (ATARAX/VISTARIL) 25 MG tablet Take 25 mg by mouth daily as needed for anxiety.    [provider]  hyoscyamine (LEVBID) 0.375 MG 12 hr tablet Take 0.375 mg by mouth every 12 (twelve) hours as needed for cramping.    [provider]  lisinopril-hydrochlorothiazide (PRINZIDE,ZESTORETIC) 20-12.5 MG tablet Take 2 tablets by mouth daily. 12/13/16   [provider]  MAGNESIUM PO Take 1 tablet by mouth daily.    [provider]  meloxicam (MOBIC) 15 MG tablet Take 15 mg by mouth daily.    [provider]  MOUNJARO 7.5 MG/0.5ML Pen Inject 7.5 mg into the skin every Thursday. 01/15/24   [provider]  Multiple Vitamin (MULTIVITAMIN WITH MINERALS) TABS tablet Take 1 tablet by mouth daily.    [provider]  NON FORMULARY Pt uses a cpap nightly    [provider]  omeprazole (PRILOSEC) 40 MG capsule Take 40 mg by mouth daily as needed (acid reflux). 02/13/17   [provider]  Oxcarbazepine (TRILEPTAL) 300 MG tablet Take 300 mg by mouth 2 (two) times daily. 06/21/21   [provider]  REPATHA SURECLICK 140 MG/ML SOAJ Inject 1 mL into the skin every 14 (fourteen) days.    [provider]  SYSTANE ULTRA 0.4-0.3 % SOLN Place 1 drop into both eyes daily as needed (dry eyes). 06/21/21   [provider]  tamsulosin (FLOMAX) 0.4 MG CAPS capsule Take 0.4 mg by mouth daily.    [provider]  tiZANidine  (ZANAFLEX ) 4 MG tablet Take 1 tablet (4 mg total) by mouth every 8 (eight) hours as needed for muscle spasms. 04/17/24   Porterfield, Amber, PA-C  Vilazodone HCl (VIIBRYD) 10 MG TABS Take 10 mg by mouth daily.    [provider]    Allergies: Propoxyphene, Doxycycline, Erythromycin base, Statins,  Sulfa antibiotics, Contrast media [iodinated contrast media], Ezetimibe , and Prednisone    Review of Systems  All other systems reviewed and are negative.   Updated Vital Signs BP 113/74   Pulse 96   Temp 98.1 F (36.7 C) (Oral)   Resp 14   Ht 5' 1 (1.549 m)   Wt 61.2 kg   SpO2 94%   BMI 25.51 kg/m   Physical Exam Vitals and nursing note reviewed.  Constitutional:      General: She is not in acute distress.    Appearance: Normal appearance.  HENT:     Head: Normocephalic and atraumatic.  Eyes:     General:        Right eye: No discharge.        Left eye: No discharge.  Cardiovascular:     Comments: Regular rate and rhythm.  S1/S2 are distinct without any evidence of murmur, rubs, or gallops.  Radial pulses are 2+ bilaterally.  Dorsalis pedis pulses are 2+ bilaterally.  No evidence of pedal edema. Pulmonary:     Comments: Clear to auscultation bilaterally.  Normal effort.  No respiratory distress.  No evidence of wheezes, rales, or rhonchi heard throughout. Abdominal:     General: Abdomen is flat. Bowel sounds are normal. There is no distension.     Tenderness: There is no abdominal tenderness. There is no guarding or rebound.  Musculoskeletal:        General: Normal range of motion.     Cervical back: Neck supple.  Skin:    General: Skin is warm and dry.     Findings: No rash.  Neurological:     General: No focal deficit present.     Mental Status: She is alert.  Psychiatric:        Mood and Affect: Mood normal.        Behavior: Behavior normal.     (all labs ordered are listed, but only abnormal results are displayed) Labs Reviewed  BASIC METABOLIC PANEL WITH GFR - Abnormal; Notable for the following components:      Result Value   Potassium 3.4 (*)    All other components within normal limits  CBC WITH DIFFERENTIAL/PLATELET  MAGNESIUM    EKG: EKG Interpretation Date/Time:  Tuesday October 07 2024 09:56:38 EST Ventricular Rate:  77 PR  Interval:  194 QRS Duration:  92 QT Interval:  376 QTC Calculation: 426 R Axis:   -31  Text Interpretation: Sinus rhythm Left axis deviation Low voltage, precordial leads Compared with prior EKG from 12/11/2023 Confirmed by Gennaro Bouchard ([id number]) on 10/07/2024 10:35:46 AM  Radiology: No results found.   Procedures   Medications Ordered in the ED - No data to display  Clinical Course as of 10/07/24 1247  Tue Oct 07, 2024  1058 CBC with Differential Normal.  [CF]  1058 Basic metabolic panel(!) Mild hypokalemia. Otherwise normal.  [CF]  1059  Magnesium Normal.  [CF]    Clinical Course User Index [CF] Theotis Cameron HERO, PA-C    Medical Decision Making Terri Daniel is a 68 y.o. female patient who presents to the emergency department today for further evaluation of atrial fibrillation with RVR.  Patient is currently in normal sinus rhythm.  She spontaneously converted prior to me seeing her in the emergency department.  Patient is completely asymptomatic.  She has no chest pain, no shortness of breath, generalized weakness, lightheadedness, dizziness.  I will plan to check some basic labs and some electrolytes.  I will also plan to observe her for a couple of hours.  Patient seems to have had paroxysmal atrial fibrillation.  Patient has remained in normal sinus rhythm since she has been here. Labs are reassuring. I do not see any evidence of atrial fibrillation with RVR on cardiac monitoring or serial EKGs.  Will plan to discharge home.  She can reschedule her cervical procedure with her surgeon.  Strict return precautions were discussed.  Will have her follow-up with her cardiologist and/or PCP.  She is safe for discharge at this time.  Amount and/or Complexity of Data Reviewed Labs: ordered. Decision-making details documented in ED Course.      Final diagnoses:  Paroxysmal atrial fibrillation [facility])    ED Discharge Orders     None          Theotis Cameron HERO,  [place] 10/07/24 1248  "

## 2024-10-07 NOTE — ED Notes (Signed)
 Pt leaving with sister in no new onset distress. Paper work and education reviewed iv removed.

## 2024-10-07 NOTE — ED Triage Notes (Signed)
 According to [facility]: Pt coming from [facility] for spinal procedure when it was discovered she was in afib with rvr. Facility gave her 2mg  of versed  before ems arrived. On route ems gave pt 800 ml normal saline.    20 gauge piv in left wrist.  Vitals 136/84 150 hr  94 cbg 99 spo2 RR 14

## 2024-10-08 ENCOUNTER — Ambulatory Visit (HOSPITAL_COMMUNITY)
Admission: RE | Admit: 2024-10-08 | Discharge: 2024-10-08 | Disposition: A | Source: Ambulatory Visit | Attending: Nurse Practitioner

## 2024-10-08 ENCOUNTER — Other Ambulatory Visit (HOSPITAL_COMMUNITY): Payer: Self-pay

## 2024-10-08 ENCOUNTER — Encounter (HOSPITAL_COMMUNITY): Payer: Self-pay | Admitting: Nurse Practitioner

## 2024-10-08 ENCOUNTER — Ambulatory Visit (HOSPITAL_COMMUNITY)
Admission: RE | Admit: 2024-10-08 | Discharge: 2024-10-08 | Attending: Nurse Practitioner | Admitting: Nurse Practitioner

## 2024-10-08 VITALS — BP 106/56 | HR 72 | Ht 61.0 in | Wt 137.0 lb

## 2024-10-08 DIAGNOSIS — G4733 Obstructive sleep apnea (adult) (pediatric): Secondary | ICD-10-CM | POA: Diagnosis not present

## 2024-10-08 DIAGNOSIS — I10 Essential (primary) hypertension: Secondary | ICD-10-CM

## 2024-10-08 DIAGNOSIS — I358 Other nonrheumatic aortic valve disorders: Secondary | ICD-10-CM

## 2024-10-08 DIAGNOSIS — E119 Type 2 diabetes mellitus without complications: Secondary | ICD-10-CM | POA: Diagnosis not present

## 2024-10-08 DIAGNOSIS — I4891 Unspecified atrial fibrillation: Secondary | ICD-10-CM | POA: Diagnosis not present

## 2024-10-08 NOTE — Patient Instructions (Addendum)
 Echocardiogram -- scheduling will call once insurance authorization received.    Remove monitor 2/18 to mail in

## 2024-10-08 NOTE — Progress Notes (Signed)
 "  Primary Care Physician: Terri Jarvis, NP Primary Cardiologist: None Electrophysiologist: None  Referring Physician: ED  Terri Daniel is a 69 y.o. female with a history of HTN, HLD, tobacco use, DM type II, anxiety, asthma, who presents for follow up in the [facility].  The patient was initially diagnosed with AF with RVR on 10/07/2024 prior to undergoing a cervical procedure.  Per EMS he had rates in the 130s and was given 800 cc normal saline prior to arrival to the ED.  She spontaneously converted prior to seeing the MD in the ED.  She was observed for a few hours with no recurrence and labs were overall stable with exception of potassium of 3.4.  She was previously evaluated in 2018 with complaint of chest pain and underwent LHC that showed no evidence of CAD.  She was recently seen by Terri Daniel on 09/11/2024 for management of HLD and was started on Repatha. Patient presents today for follow up for atrial fibrillation.   Terri Daniel presents today with her daughter for post ED follow-up. She experienced an episode of atrial fibrillation that resolved by the time she arrived at the emergency department. She was asymptomatic during the episode and remains in rhythm with well-controlled blood pressure. Her CHADS-VASc score is 4.She has significant neck pain due to two herniated discs, requiring a fusion surgery. The pain has been severe and unrelenting since August, significantly impacting her quality of life. She describes the pain as 'wanting to jump off the roof' and manages it with gabapentin, which has made it tolerable. She also has a history of fibromyalgia and interstitial cystitis, contributing to her high pain tolerance. She has undergone significant lifestyle changes, including losing over 40 pounds, which has improved her overall health. She was previously tested for sleep apnea, which was negative, but she used a CPAP machine for reassurance. She does not consume  alcohol and maintains a healthy diet. Her current medications include Mounjaro for blood sugar control and Repatha for cholesterol management. Her potassium levels were noted to be slightly low in recent lab work, and she takes magnesium supplements daily. She is currently on hydrochlorothiazide, which may contribute to her low potassium levels.  Today, she denies symptoms of palpitations, chest pain, shortness of breath, orthopnea, PND, lower extremity edema, dizziness, presyncope, syncope, snoring, daytime somnolence, bleeding, or neurologic sequela. The patient is tolerating medications without difficulties and is otherwise without complaint today.   Discussed the use of AI scribe software for clinical note transcription with the patient, who gave verbal consent to proceed.     Notes: -TSH was normal - Need supdated 2D echo -14 day zio - CHA2DS2-VASc is 4 (4.8%)  Atrial Fibrillation Management history: History of early familial atrial fibrillation or other arrhythmias  Previous antiarrhythmic drugs: None Previous cardioversions: None Previous ablations: None Anticoagulation history: None  ROS- All systems are reviewed and negative except as per the HPI above.  Past Medical History:  Diagnosis Date   Anxiety    Arthritis    Asthma    Bulging lumbar disc    Chronic interstitial cystitis    Complication of anesthesia    Slow to wake up, difficult to numb   Depression    Diabetes mellitus without complication (HCC)    Disorder of left rotator cuff    Family history of adverse reaction to anesthesia    Mother hard to wake up   Fibromyalgia    GERD (gastroesophageal reflux disease)  Glaucoma    HLD (hyperlipidemia)    Hypertension    Hypertensive retinopathy    Tobacco abuse    Transaminitis    Past Surgical History:  Procedure Laterality Date   ABDOMINAL HYSTERECTOMY     BLEPHAROPLASTY Bilateral    BREAST BIOPSY Left    BREAST CYST EXCISION Right    CATARACT  EXTRACTION     CYSTO     CYSTOSCOPY     EYE SURGERY  12/2023   EYE SURGERY Left 09/2023   hydrodistention     with botox   JOINT REPLACEMENT Left 2009   Knee   LEFT HEART CATH AND CORONARY ANGIOGRAPHY N/A 02/07/2017   Procedure: Left Heart Cath and Coronary Angiography;  Surgeon: Terri Daniel LABOR, MD;  Location: [facility] CV [facility];  Service: Cardiovascular;  Laterality: N/A;   REVERSE SHOULDER ARTHROPLASTY Right 04/17/2024   Procedure: RIGHT REVERSE TOTAL SHOULDER ARTHROPLASTY;  Surgeon: Terri Soulier, MD;  Location: [facility];  Service: Orthopedics;  Laterality: Right;   TUBAL LIGATION     ULNAR NERVE REPAIR     Propoxyphene, Doxycycline, Erythromycin base, Statins, Sulfa antibiotics, Contrast media [iodinated contrast media], Ezetimibe , and Prednisone Current Outpatient Medications  Medication Sig Dispense Refill   ACCU-CHEK GUIDE test strip USE DAILY AS DIRECTED 100 strip 3   albuterol (PROVENTIL HFA;VENTOLIN HFA) 108 (90 Base) MCG/ACT inhaler Inhale 1-2 puffs into the lungs every 6 (six) hours as needed for wheezing or shortness of breath.     amLODipine (NORVASC) 2.5 MG tablet Take 2.5 mg by mouth daily.     Ascorbic Acid (VITAMIN C PO) Take 1 tablet by mouth daily.     bimatoprost (LUMIGAN) 0.01 % SOLN Place 1 drop into both eyes at bedtime.     diclofenac sodium (VOLTAREN) 1 % GEL Place 1 application  onto the skin 2 (two) times daily as needed (pain).     gabapentin (NEURONTIN) 100 MG capsule Take 100 mg by mouth 3 (three) times daily.     HYDROcodone-acetaminophen  (NORCO/VICODIN) 5-325 MG tablet TAKE 1 TABLET EVERY 6 HOURS BY ORAL ROUTE AS NEEDED, FOR PAIN. (Patient taking differently: Take 1 tablet by mouth as needed.)     hydrOXYzine (ATARAX/VISTARIL) 25 MG tablet Take 25 mg by mouth daily as needed for anxiety. (Patient taking differently: Take 25 mg by mouth as needed for anxiety.)     hyoscyamine (LEVBID) 0.375 MG 12 hr tablet Take 0.375 mg by mouth every 12 (twelve) hours as  needed for cramping. (Patient taking differently: Take 0.375 mg by mouth as needed for cramping.)     lisinopril-hydrochlorothiazide (PRINZIDE,ZESTORETIC) 20-12.5 MG tablet Take 2 tablets by mouth daily.     meloxicam (MOBIC) 15 MG tablet Take 15 mg by mouth daily.     Menthol, Topical Analgesic, (PAIN RELIEVING PATCH ULTRA ST EX) Apply topically as needed.     MOUNJARO 7.5 MG/0.5ML Pen Inject 7.5 mg into the skin every Thursday.     Multiple Vitamin (MULTIVITAMIN WITH MINERALS) TABS tablet Take 1 tablet by mouth daily.     omeprazole (PRILOSEC) 40 MG capsule Take 40 mg by mouth daily as needed (acid reflux). (Patient taking differently: Take 40 mg by mouth as needed (acid reflux).)     Oxcarbazepine (TRILEPTAL) 300 MG tablet Take 300 mg by mouth 2 (two) times daily. (Patient taking differently: Take 300 mg by mouth at bedtime.)     REPATHA SURECLICK 140 MG/ML SOAJ Inject 1 mL into the skin every 14 (fourteen) days.  SYSTANE ULTRA 0.4-0.3 % SOLN Place 1 drop into both eyes daily as needed (dry eyes).     tamsulosin (FLOMAX) 0.4 MG CAPS capsule Take 0.4 mg by mouth daily. (Patient taking differently: Take 0.4 mg by mouth as needed.)     tiZANidine  (ZANAFLEX ) 4 MG tablet Take 1 tablet (4 mg total) by mouth every 8 (eight) hours as needed for muscle spasms. (Patient taking differently: Take 4 mg by mouth in the morning, at noon, and at bedtime.) 30 tablet 0   MAGNESIUM PO Take 1 tablet by mouth daily.     No current facility-administered medications for this encounter.    Physical Exam: BP (!) 106/56   Pulse 72   Ht 5' 1 (1.549 m)   Wt 62.1 kg   BMI 25.89 kg/m   GEN: Well nourished, well developed in no acute distress NECK: No JVD; No carotid bruits CARDIAC: Regular rate and rhythm, no murmurs, rubs, gallops Murmur:2/5 systolic murmur at LUSB RESPIRATORY:  Clear to auscultation without rales, wheezing or rhonchi  ABDOMEN: Soft, non-tender, non-distended EXTREMITIES:  No edema; No  deformity   Wt Readings from Last 3 Encounters:  10/08/24 62.1 kg  10/07/24 61.2 kg  09/11/24 64.6 kg    Lab Results  Component Value Date   TSH 1.14 02/08/2024   EKG today demonstrates:   EKG Interpretation Date/Time:  Wednesday October 08 2024 14:13:28 EST Ventricular Rate:  72 PR Interval:  180 QRS Duration:  68 QT Interval:  364 QTC Calculation: 398 R Axis:   -12  Text Interpretation: Normal sinus rhythm with sinus arrhythmia Low voltage QRS Poor R wave progression When compared with ECG of 07-Oct-2024 09:56, PREVIOUS ECG IS PRESENT Confirmed by Wyn Manus [phone]) on 10/08/2024 2:15:24 PM        Echo Completed 02/06/2017: -- Left ventricle: The cavity size was normal. Systolic function was    normal. The estimated ejection fraction was in the range of 60%    to 65%. Wall motion was normal; there were no regional wall    motion abnormalities. Left ventricular diastolic function    parameters were normal.  - Atrial septum: No defect or patent foramen ovale was identified.  - Impressions: Prominent epicardial fat pad.   CHA2DS2-VASc Score = 4  The patient's score is based upon: CHF History: 0 HTN History: 1 Diabetes History: 1 Stroke History: 0 Vascular Disease History: 0 Age Score: 1 Gender Score: 1       ASSESSMENT AND PLAN: Paroxysmal Atrial Fibrillation (ICD10:  I48.0) The patient's CHA2DS2-VASc score is 4, indicating a 4.8% annual risk of stroke.   -Pain and stress from neck herniated discs may have triggered the episode. -Weight loss and sleep apnea management may have reduced AFib risk. - Ordered event monitor for two weeks to assess for AFib recurrence. - Ordered updated echocardiogram to evaluate for structural heart changes. - Encouraged use of smartwatch for heart rate monitoring. - Discussed lifestyle modifications: maintain activity, avoid alcohol, manage blood pressure and blood sugar. - Schedule follow-up in one month to review monitor  results.  HTN: BP well controlled. Continue current antihypertensive regimen.   DM Type II: - Patient is on GLP-1 with controlled blood sugars  History of OSA: -Patient is compliant with CPAP nightly  Systolic Murmur: - Low-grade murmur auscultated on exam -2D echo ordered to rule out valvular heart disease and structural heart changes   Signed,  Wyn Raddle, Manus Shove, NP    10/08/2024 2:48 PM  Follow up with the AF Clinic in 1 month  "

## 2024-10-14 ENCOUNTER — Ambulatory Visit (HOSPITAL_COMMUNITY)

## 2024-11-05 ENCOUNTER — Ambulatory Visit (HOSPITAL_COMMUNITY): Admitting: Nurse Practitioner
